# Patient Record
Sex: Male | Born: 1980
Health system: Southern US, Community
[De-identification: ages and names within clinical notes are randomized; demographics above are authoritative.]

## PROBLEM LIST (undated history)

## (undated) DIAGNOSIS — I499 Cardiac arrhythmia, unspecified: Secondary | ICD-10-CM

## (undated) DIAGNOSIS — T7840XA Allergy, unspecified, initial encounter: Secondary | ICD-10-CM

## (undated) DIAGNOSIS — E785 Hyperlipidemia, unspecified: Secondary | ICD-10-CM

## (undated) HISTORY — DX: Hyperlipidemia, unspecified: E78.5

## (undated) HISTORY — DX: Allergy, unspecified, initial encounter: T78.40XA

## (undated) HISTORY — DX: Cardiac arrhythmia, unspecified: I49.9

---

## 2019-03-18 ENCOUNTER — Telehealth: Payer: Self-pay | Admitting: Family

## 2019-03-18 DIAGNOSIS — R200 Anesthesia of skin: Secondary | ICD-10-CM

## 2019-03-18 NOTE — Progress Notes (Signed)
Based on what you shared with me, I feel your condition warrants further evaluation and I recommend that you be seen for a face to face office visit.  Given your numbness in your right hand, you need to be seen face to face to rule out other serious problems.    NOTE: If you entered your credit card information for this eVisit, you will not be charged. You may see a "hold" on your card for the $35 but that hold will drop off and you will not have a charge processed.  If you are having a true medical emergency please call 911.  If you need an urgent face to face visit, [place] has four urgent care centers for your convenience.    PLEASE NOTE: THE [facility] LOCATIONS AND URGENT CARE CLINICS DO NOT HAVE THE TESTING FOR CORONAVIRUS COVID19 AVAILABLE.  IF YOU FEEL YOU NEED THIS TEST YOU MUST GO TO A TRIAGE LOCATION AT ONE OF THE HOSPITAL EMERGENCY DEPARTMENTS   [email or web address] to reserve your spot online an avoid wait times  [facility] [address] [place], [place] [address] Modified hours of operation: Monday-Friday, 10 AM to 6 PM  Saturday & Sunday 10 AM to 4 PM *Across the street from [employer]  [facility] (New Address!) [address] [place], [place] [address] *Just off [employer], across the road from [place]* Modified hours of operation: Monday-Friday, 10 AM to 5 PM  Closed Saturday & Sunday   The following sites will take your insurance:  . [facility]  [phone] Get Driving Directions Find a Provider at this Location  [address] [place], [place] [address] . 10 am to 8 pm Monday-Friday . 12 pm to 8 pm Saturday-Sunday   . [facility]  [phone] Get Driving Directions Find a Provider at this Location  [address] [place], [place] [address] . 8 am to 8 pm Monday-Friday . 9 am to 6 pm Saturday . 11 am to 6 pm  Sunday   . [facility] at [facility]  [phone] Get Driving Directions  [address] [place], [place] [address] . 8 am to 8 pm Monday-Friday . 8 am to 4 pm Saturday-Sunday   Your e-visit answers were reviewed by a board certified advanced clinical practitioner to complete your personal care plan.  Thank you for using e-Visits.

## 2019-04-24 ENCOUNTER — Other Ambulatory Visit: Payer: Self-pay

## 2019-04-24 ENCOUNTER — Encounter: Payer: Self-pay | Admitting: Family Medicine

## 2019-04-24 ENCOUNTER — Ambulatory Visit (INDEPENDENT_AMBULATORY_CARE_PROVIDER_SITE_OTHER): Payer: No Typology Code available for payment source | Admitting: Family Medicine

## 2019-04-24 DIAGNOSIS — R209 Unspecified disturbances of skin sensation: Secondary | ICD-10-CM | POA: Diagnosis not present

## 2019-04-24 DIAGNOSIS — R0602 Shortness of breath: Secondary | ICD-10-CM

## 2019-04-24 DIAGNOSIS — R002 Palpitations: Secondary | ICD-10-CM | POA: Diagnosis not present

## 2019-04-24 NOTE — Progress Notes (Signed)
Virtual Visit via Video Note   I connected with [name] on 04/25/19 at 12:00 PM EDT by a video enabled telemedicine application and verified that I am speaking with the correct person using two identifiers.  Location patient: home Location provider:work Persons participating in the virtual visit: patient, provider  I discussed the limitations of evaluation and management by telemedicine and the availability of in person appointments. The patient expressed understanding and agreed to proceed.   HPI: [name] is a 38 yo male otherwise healthy,who is establishing care today. He has some concerns today.  C/O episode of SOB,feeling like something is moving in his whole chest (?palpitations),right hand and left thigh numbness, and feeling like he was going to pass out. It has happened 2 times when he was in his car (driving), 2/533/30 and 6/6/444/8/20.Marland Kitchen.   He has had some of these symptoms but milder when he is in his office but with no numbness sensation.  Denies associated headache,visual changes, cough,wheezing, abdominal pain, nausea, vomiting, focal weakness,or seizure like activity.  Usually last about 10-15 min. Alleviated by "fresh and cold air" like the [facility] in his car. He is not sure about exacerbating factors.  He is reporting this is a new problem.  He denies history of depression or anxiety but states that since the beginning of March he has been under a lot of stress.  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 He lives with his family.  He has not tried OTC medications. He denies history of tobacco use or illicit drugs. He is reporting years history of "cardiac arrhythmia", he is not sure about etiology or cardiac work-up.  He is also complaining about a few months of intermittent chest and lower back "needle sensation" He denies upper or lower back pain. Negative for fever, chills, night sweats, arthritis, or skin rash.   ROS: See  pertinent positives and negatives per HPI.  Past Medical History:  Diagnosis Date  . Cardiac arrhythmia, unspecified     History reviewed. No pertinent surgical history.  Family History  Problem Relation Age of Onset  . Diabetes Mother   . Hypertension Father     Social History   Socioeconomic History  . Marital status: Single    Spouse name: Not on file  . Number of children: Not on file  . Years of education: Not on file  . Highest education level: Not on file  Occupational History  . Not on file  Social Needs  . Financial resource strain: Not on file  . Food insecurity:    Worry: Not on file    Inability: Not on file  . Transportation needs:    Medical: Not on file    Non-medical: Not on file  Tobacco Use  . Smoking status: Never Smoker  . Smokeless tobacco: Never Used  Substance and Sexual Activity  . Alcohol use: Not on file  . Drug use: Never  . Sexual activity: Not on file  Lifestyle  . Physical activity:    Days per week: Not on file    Minutes per session: Not on file  . Stress: Not on file  Relationships  . Social connections:    Talks on phone: Not on file    Gets together: Not on file    Attends religious service: Not on file    Active member of club or organization: Not on file    Attends meetings  of clubs or organizations: Not on file    Relationship status: Not on file  . Intimate partner violence:    Fear of current or ex partner: Not on file    Emotionally abused: Not on file    Physically abused: Not on file    Forced sexual activity: Not on file  Other Topics Concern  . Not on file  Social History Narrative  . Not on file   No current outpatient medications on file.  EXAM:  VITALS per patient if applicable:N/A  GENERAL: alert, oriented, appears well and in no acute distress  HEENT: atraumatic, conjunttiva clear, no obvious facial abnormalities on inspection.  NECK: normal movements of the head and neck  LUNGS: on inspection  no signs of respiratory distress, breathing rate appears normal, no obvious gross SOB, gasping or wheezing  CV: no obvious cyanosis  MS: moves all visible extremities without noticeable abnormality  PSYCH/NEURO: pleasant and cooperative, no obvious depression,he is anxious.Speech and thought processing grossly intact  ASSESSMENT AND PLAN:  Discussed the following assessment and plan: Orders Placed This Encounter  Procedures  . CBC with Differential/Platelet  . Comprehensive metabolic panel  . Vitamin B12  . TSH  . EKG 12-Lead    Skin sensation disturbance  We discussed possible etiologies. ? Anxiety,electrolyte abnormality,endocrinologic disorder among some. He has not had symptoms for the past 3-4 weeks. Instructed about warning signs.  Palpitations He is reporting years Hx of arrhythmia,he is not sure if he has had cardiac work-up. ? Anxiety. Adequate hydration. We will arrange blood work and EKG. Instructed about warning signs.  Shortness of breath - Plan: EKG 12-Lead Possible causes discussed. No associated cough or wheezing. I do not think imaging is needed at this time. EKG and blood work will be arranged. Instructed about warning signs.    I discussed the assessment and treatment plan with the patient. He was provided an opportunity to ask questions and all were answered. The patient agreed with the plan and demonstrated an understanding of the instructions.   The patient was advised to call back or seek an in-person evaluation if the symptoms worsen or if the condition fails to improve as anticipated.  Return if symptoms worsen or fail to improve, for Depending on work up labs and EKG.    Betty Swaziland, MD

## 2019-04-25 ENCOUNTER — Encounter: Payer: Self-pay | Admitting: Family Medicine

## 2019-05-05 ENCOUNTER — Other Ambulatory Visit: Payer: No Typology Code available for payment source

## 2019-05-05 ENCOUNTER — Telehealth: Payer: Self-pay | Admitting: *Deleted

## 2019-05-05 NOTE — Telephone Encounter (Signed)
Copied from CRM [id number]. Topic: Appointment Scheduling - Scheduling Inquiry for Clinic >> May 05, 2019  7:54 AM Fanny Bien wrote: Reason for CRM: pt called and canceled labs for today because of a low grade fever. Pt would like to talk to betty Swaziland about fever. Please advise

## 2019-05-06 ENCOUNTER — Encounter: Payer: Self-pay | Admitting: Family Medicine

## 2019-05-06 ENCOUNTER — Ambulatory Visit (INDEPENDENT_AMBULATORY_CARE_PROVIDER_SITE_OTHER): Payer: No Typology Code available for payment source | Admitting: Family Medicine

## 2019-05-06 ENCOUNTER — Other Ambulatory Visit: Payer: Self-pay

## 2019-05-06 ENCOUNTER — Telehealth: Payer: Self-pay | Admitting: *Deleted

## 2019-05-06 VITALS — Resp 12

## 2019-05-06 DIAGNOSIS — R509 Fever, unspecified: Secondary | ICD-10-CM | POA: Diagnosis not present

## 2019-05-06 DIAGNOSIS — Z20822 Contact with and (suspected) exposure to covid-19: Secondary | ICD-10-CM

## 2019-05-06 DIAGNOSIS — Z20828 Contact with and (suspected) exposure to other viral communicable diseases: Secondary | ICD-10-CM

## 2019-05-06 NOTE — Telephone Encounter (Signed)
Patient scheduled virtual visit for today, 05/06/2019 with Dr. Swaziland.

## 2019-05-06 NOTE — Progress Notes (Addendum)
Virtual Visit via Video Note   I connected with Joel Thompson on 05/06/19 at  3:30 PM EDT by a video enabled telemedicine application and verified that I am speaking with the correct person using two identifiers.  Location patient: home Location provider:home office Persons participating in the virtual visit: patient, provider  I discussed the limitations of evaluation and management by telemedicine and the availability of in person appointments. The patient expressed understanding and agreed to proceed.   HPI: Joel Thompson is a 38 yo male c/o "low grade fever" noted Monday, 3 days ago, 99.4 F. Checked temp initially because he felt warm.  Yesterday temp 99.2 F.  He has not had other symptom. He denies sick contact,travel,or possible contact with person infected with COVID 19.  Denies fatigue,changes in appetite, body aches,rash,headache,sore throat,cough,dyspena,wheezing,abdominal pain,nausea,vomiting,changes in bowel habits,or urinary symptoms. Nasal congestion,exacerbated by lying down.  He has not taken OTC medication.   ROS: See pertinent positives and negatives per HPI.  Past Medical History:  Diagnosis Date  . Cardiac arrhythmia, unspecified     No past surgical history on file.  Family History  Problem Relation Age of Onset  . Diabetes Mother   . Hypertension Father     Social History   Socioeconomic History  . Marital status: Single    Spouse name: Not on file  . Number of children: Not on file  . Years of education: Not on file  . Highest education level: Not on file  Occupational History  . Not on file  Social Needs  . Financial resource strain: Not on file  . Food insecurity:    Worry: Not on file    Inability: Not on file  . Transportation needs:    Medical: Not on file    Non-medical: Not on file  Tobacco Use  . Smoking status: Never Smoker  . Smokeless tobacco: Never Used  Substance and Sexual Activity  . Alcohol use: Not on file  . Drug use: Never   . Sexual activity: Not on file  Lifestyle  . Physical activity:    Days per week: Not on file    Minutes per session: Not on file  . Stress: Not on file  Relationships  . Social connections:    Talks on phone: Not on file    Gets together: Not on file    Attends religious service: Not on file    Active member of club or organization: Not on file    Attends meetings of clubs or organizations: Not on file    Relationship status: Not on file  . Intimate partner violence:    Fear of current or ex partner: Not on file    Emotionally abused: Not on file    Physically abused: Not on file    Forced sexual activity: Not on file  Other Topics Concern  . Not on file  Social History Narrative  . Not on file     No current outpatient medications on file.  EXAM:  VITALS per patient if applicable:Resp 12   GENERAL: alert, oriented, appears well and in no acute distress  HEENT: atraumatic, conjunctiva clear, no obvious abnormalities on inspection of external nose/face.  NECK: normal movements of the head and neck  LUNGS: on inspection no signs of respiratory distress, breathing rate appears normal, no obvious gross SOB, gasping or wheezing  CV: no obvious cyanosis  MS: moves all visible extremities without noticeable abnormality  PSYCH/NEURO: pleasant and cooperative, no obvious depression,anxious;speech and thought  processing grossly intact  ASSESSMENT AND PLAN:  Discussed the following assessment and plan:  Fever, unspecified Possible etiologies discussed. Explained that temp < 100 F is not considered as fever but could be the beginning of a viral synd. He is concerned about COVID 19, test will be arranged. Quarantine recommended until result is back. Instructed about warning signs.   15  min face to face OV. > 50% was dedicated to discussion of Dx, prognosis, treatment options, and coordination of care.  I discussed the assessment and treatment plan with the patient.  He was provided an opportunity to ask questions and all were answered. The patient agreed with the plan and demonstrated an understanding of the instructions.   The patient was advised to call back or seek an in-person evaluation if the symptoms worsen or if the condition fails to improve as anticipated.  Return if symptoms worsen or fail to improve.     [place], MD

## 2019-05-06 NOTE — Telephone Encounter (Signed)
Left message for patient to return call to office. 

## 2019-05-06 NOTE — Telephone Encounter (Signed)
Pt scheduled for covid testing tomorrow 0930 at [facility] site. Orders per Dr. Betty Swaziland. Pt with fever x 3 days. Reviewed testing process with pt; verbalizes understanding.

## 2019-05-07 ENCOUNTER — Other Ambulatory Visit: Payer: No Typology Code available for payment source

## 2019-05-07 DIAGNOSIS — Z20822 Contact with and (suspected) exposure to covid-19: Secondary | ICD-10-CM

## 2019-05-08 LAB — NOVEL CORONAVIRUS, NAA: SARS-CoV-2, NAA: NOT DETECTED

## 2019-05-13 ENCOUNTER — Telehealth: Payer: Self-pay | Admitting: *Deleted

## 2019-05-13 NOTE — Telephone Encounter (Signed)
Left message to return call to the office to schedule lab appointment.

## 2019-05-13 NOTE — Telephone Encounter (Signed)
Copied from CRM [id number]. Topic: General - Other >> May 13, 2019 12:10 PM Jaquita Rector A wrote: Reason for CRM: Patient called to reschedule his lab appointment can be reached at Ph# [phone]

## 2019-05-14 ENCOUNTER — Encounter: Payer: Self-pay | Admitting: *Deleted

## 2019-05-14 ENCOUNTER — Telehealth: Payer: Self-pay | Admitting: Family Medicine

## 2019-05-14 NOTE — Telephone Encounter (Signed)
Left message for patient to return call to office to reschedule lab appointment.

## 2019-05-14 NOTE — Telephone Encounter (Addendum)
Patient has a question on the labs and EKG that was ordered.  He states he was having chest pains this past Saturday, 05/09/19, so he went to the ED.  He would like a call to find out if he needs the CBC, metabolic panel and EKG done due to the ED doing the same lab and EFG.  He went to [facility] in [place].

## 2019-05-15 ENCOUNTER — Other Ambulatory Visit: Payer: No Typology Code available for payment source

## 2019-05-15 NOTE — Telephone Encounter (Signed)
Patient returned called to office and stated that he went to the ER and they did labs and EKG.

## 2019-05-15 NOTE — Telephone Encounter (Signed)
Message sent to Dr. Jordan for review. Please advise 

## 2019-05-15 NOTE — Telephone Encounter (Signed)
I cannot see ER visit. On 04/24/19 I placed future orders for EKG,TSH,CMP,CBC,and B12. If these labs and EKG were done,they do not need to be repeated unless something is abnormal.  Please ask him to have labs fax to the office.  Most likely he was also recommended to follow with PCP if not better.  Thanks, BJ

## 2019-05-18 ENCOUNTER — Encounter: Payer: Self-pay | Admitting: *Deleted

## 2019-05-18 NOTE — Telephone Encounter (Signed)
Patient informed. 

## 2019-05-21 ENCOUNTER — Telehealth: Payer: Self-pay | Admitting: Family Medicine

## 2019-05-21 ENCOUNTER — Encounter: Payer: Self-pay | Admitting: Family Medicine

## 2019-05-21 ENCOUNTER — Telehealth: Payer: Self-pay | Admitting: *Deleted

## 2019-05-21 ENCOUNTER — Ambulatory Visit (INDEPENDENT_AMBULATORY_CARE_PROVIDER_SITE_OTHER): Payer: No Typology Code available for payment source | Admitting: Family Medicine

## 2019-05-21 ENCOUNTER — Other Ambulatory Visit: Payer: Self-pay

## 2019-05-21 ENCOUNTER — Telehealth: Payer: Self-pay

## 2019-05-21 VITALS — Temp 98.7°F

## 2019-05-21 DIAGNOSIS — R05 Cough: Secondary | ICD-10-CM | POA: Diagnosis not present

## 2019-05-21 DIAGNOSIS — R6889 Other general symptoms and signs: Secondary | ICD-10-CM | POA: Diagnosis not present

## 2019-05-21 DIAGNOSIS — R059 Cough, unspecified: Secondary | ICD-10-CM

## 2019-05-21 DIAGNOSIS — Z20822 Contact with and (suspected) exposure to covid-19: Secondary | ICD-10-CM

## 2019-05-21 DIAGNOSIS — R0981 Nasal congestion: Secondary | ICD-10-CM | POA: Diagnosis not present

## 2019-05-21 NOTE — Telephone Encounter (Addendum)
Patient called and advised of the request for covid testing by Dr. Maudie Mercury, he says the [facility] scheduled him for tomorrow at [facility], so he will go there.

## 2019-05-21 NOTE — Telephone Encounter (Signed)
-----   Message from Lucretia Kern, DO sent at 05/21/2019  1:51 PM EDT ----- -coronavirus testing-follow up with Dr. Martinique or Maudie Mercury on Monday or Tuesday

## 2019-05-21 NOTE — Patient Instructions (Addendum)
I have asked my assistant to order Coronavirus (COVID19) testing for you. Please call our office if you have any concerns or questions or this testing has not been arranged in the next 24-48 hours.  Please call [facility] today in terms of work restrictions.   Self Isolate: -see the CDC site for information:   RunningShows.co.za.html   -stay home except for to seek medical care -stay in your own room away from others in your house, wash hands frequently, wear a mask if you leave your room and interacted as little as possible with others -seek medical care if worsening - call out office for a visit or call ahead if going elsewhere -seek emergency care if very sick or severe symptoms - call 911 -isolate for at least 10 days from the onset of symptoms PLUS 3 days of no fever PLUS 3 days of improving symptoms, unless instructed otherwise by a doctor.   Follow up with Dr. Martinique on Monday or Tuesday. Sooner as needed.

## 2019-05-21 NOTE — Telephone Encounter (Signed)
Community message sent to the [facility] for COVID testing and I left a detailed message at the pts cell number someone will call with appt info and that he call back to schedule a follow up visit as below.

## 2019-05-21 NOTE — Telephone Encounter (Signed)
Copied from [place] [id number]. Topic: Quick Communication - See Telephone Encounter >> May 21, 2019  2:34 PM Blase Mess A wrote: CRM for notification. See Telephone encounter for: 05/21/19.  Patient is calling back that an order was placed by [facility] [employer] for [employer]. Thank you CB- [phone]

## 2019-05-21 NOTE — Progress Notes (Signed)
Virtual Visit via Video Note  I connected with EJ  on 05/21/19 at  1:20 PM EDT by a video enabled telemedicine application and verified that I am speaking with the correct person using two identifiers.  Location patient: home Location provider:work or home office Persons participating in the virtual visit: patient, provider  I discussed the limitations of evaluation and management by telemedicine and the availability of in person appointments. The patient expressed understanding and agreed to proceed.   HPI:  Acute visit for feeling sick/? Fever: -started 2 days ago -symptoms include feeling like has a fever "feeling warm", fatigue -Temp 99.5 for a few days -temp today 99 -other symptoms: headache - brief, sinus congestion, cough - but has had some in the past -no diarrhea, vomiting, rash, sick contacts -does work outside the home, IT at [employer], wears mask, has not been around anyone whom has been sick -he works around a lot of people, does want to test for COVID19 -history of some resp symptoms a few weeks ago and was tested - he thinks was related to exposure in the building he was in, old, dusty building  as he was feeling better when he moved to another location/building   ROS: See pertinent positives and negatives per HPI.  Past Medical History:  Diagnosis Date  . Cardiac arrhythmia, unspecified     History reviewed. No pertinent surgical history.  Family History  Problem Relation Age of Onset  . Diabetes Mother   . Hypertension Father     SOCIAL HX: see HPI  No current outpatient medications on file.  EXAM:  VITALS per patient if applicable:  GENERAL: alert, oriented, appears well and in no acute distress  HEENT: atraumatic, conjunttiva clear, no obvious abnormalities on inspection of external nose and ears  NECK: normal movements of the head and neck  LUNGS: on inspection no signs of respiratory distress, breathing rate appears normal, no obvious gross  SOB, gasping or wheezing  CV: no obvious cyanosis  MS: moves all visible extremities without noticeable abnormality  PSYCH/NEURO: pleasant and cooperative, no obvious depression or anxiety, speech and thought processing grossly intact  ASSESSMENT AND PLAN:  Discussed the following assessment and plan: More than 50% of over 25 minutes spent counseling and/or coordinating care.   Suspected 2019 Novel Coronavirus Infection   Sinus congestion   Cough  -we discussed possible serious and likely etiologies, workup and treatment, treatment risks and return precautions; possible allergies, viral infection or COVID19. -after this discussion, Prakash opted for COVID19 testing, nasal saline, zyrtec or allergy pill, OTC analgesic if needed per instructions. -discussed self isolation and advised he call health at work as well for work restrictions -follow up advised weih PCP Monday or tuesday -of course, we advised Gwin  to return or notify a doctor immediately if symptoms worsen or persist or new concerns arise.    I discussed the assessment and treatment plan with the patient. The patient was provided an opportunity to ask questions and all were answered. The patient agreed with the plan and demonstrated an understanding of the instructions.  Patient Instructions  I have asked my assistant to order Coronavirus (COVID19) testing for you. Please call our office if you have any concerns or questions or this testing has not been arranged in the next 24-48 hours.  Please call [facility] today in terms of work restrictions.   Self Isolate: -see the CDC site for information:   RunningShows.co.za.html   -stay home except for  to seek medical care -stay in your own room away from others in your house, wash hands frequently, wear a mask if you leave your room and interacted as little as possible with others -seek medical care if worsening  - call out office for a visit or call ahead if going elsewhere -seek emergency care if very sick or severe symptoms - call 911 -isolate for at least 10 days from the onset of symptoms PLUS 3 days of no fever PLUS 3 days of improving symptoms, unless instructed otherwise by a doctor.   Follow up with Dr. SwazilandJordan on Monday or Tuesday. Sooner as needed.    Follow up instructions: Advised assistant Ronnald CollumJo Anne to help patient arrange the following: -coronavirus testing -follow up with Dr. SwazilandJordan or Selena BattenKim on Monday or Tuesday   Terressa KoyanagiHannah R Kim, DO

## 2019-05-26 ENCOUNTER — Other Ambulatory Visit: Payer: Self-pay

## 2019-05-26 ENCOUNTER — Encounter: Payer: Self-pay | Admitting: Family Medicine

## 2019-05-26 ENCOUNTER — Ambulatory Visit (INDEPENDENT_AMBULATORY_CARE_PROVIDER_SITE_OTHER): Payer: No Typology Code available for payment source | Admitting: Family Medicine

## 2019-05-26 DIAGNOSIS — E876 Hypokalemia: Secondary | ICD-10-CM

## 2019-05-26 DIAGNOSIS — M7918 Myalgia, other site: Secondary | ICD-10-CM | POA: Diagnosis not present

## 2019-05-26 DIAGNOSIS — R1011 Right upper quadrant pain: Secondary | ICD-10-CM | POA: Diagnosis not present

## 2019-05-26 NOTE — Progress Notes (Signed)
Virtual Visit via Video Note   I connected with [name] on 05/26/19 at  3:45 PM EDT by a video enabled telemedicine application and verified that I am speaking with the correct person using two identifiers.  Location patient: home Location provider:work office Persons participating in the virtual visit: patient, provider  I discussed the limitations of evaluation and management by telemedicine and the availability of in person appointments. The patient expressed understanding and agreed to proceed.   HPI: He is following today on recent OV and ER visit. I last saw him through virtual visit on 05/06/2019, when he was complaining about fever. COVID-19 testing was negative. She has not had fever since Saturday, 99.5 F  He was evaluated in the ER yesterday, when he was complaining about upper abdominal pain (RUQ). Today he has not had abdominal pain. Denies associated chills, fatigue, chest pain, nausea, vomiting, changes in bowel habits, or urinary symptoms.  He has not identified exacerbating or alleviating factors. RUQ [place] was negative for gallbladder disease. He was prescribed Bentyl.   Urinalysis with Possible Microscopy and Possible Culture (05/25/2019 1:17 AM EDT) Urinalysis with Possible Microscopy and Possible Culture (05/25/2019 1:17 AM EDT)  Component Value Ref Range Performed At Pathologist Signature  Urine Color Yellow Yellow  [facility]   Urine Appearance Clear Clear [facility]   Urine Specific Gravity 1.021 1.005 - 1.030 [facility]   Urine pH 6.0 5 to 9 [facility]   Urine Protein - Dipstick Negative Negative mg/dl [facility]   Urine Glucose Negative Negative mg/dL [facility]   Urine Ketones Trace (A) Negative mg/dl [facility]   Urine Bilirubin Negative Negative mg/dL [facility]   Urine Blood Negative Negative mg/dL [facility]   Urine Urobilinogen <2   <2 mg/dl [facility]   Urine Nitrite Negative Negative [place]   Urine Leukocyte Esterase Negative Negative Leu/mcL [place]    Lipase (05/25/2019 1:04 AM EDT) Lipase (05/25/2019 1:04 AM EDT)  Component Value Ref Range Performed At Pathologist Signature  Lipase 43 0 - 59 IU/L     Mild hypoK+.  Comprehensive Metabolic Panel (68/34/1962 1:04 AM EDT) Comprehensive Metabolic Panel (22/97/9892 1:04 AM EDT)  Component Value Ref Range Performed At Pathologist Signature  Na 138 136 - 146 mmol/L [facility]   Potassium 3.5 (L) 3.7 - 5.4 mmol/L [facility]   Cl 99 97 - 108 mmol/L [facility]   CO2 24 20 - 32 mmol/L [facility]   Glucose 114 (H) 65 - 99 mg/dL [facility]   BUN 10 6 - 20 mg/dL [facility]   Creatinine 0.80 0.76 - 1.27 mg/dL [facility]   Ca 9.2 8.7 - 10.2 mg/dL [facility]   ALK PHOS 65 25 - 150 IU/L [facility]   T Bili 0.60 0.00 - 1.20 mg/dL [facility]   Total Protein 7.2 6.0 - 8.5 gm/dL [facility]   Alb 4.5 3.5 - 5.5 gm/dL [facility]   GLOBULIN 2.7 1.5 - 4.5 gm/dL [facility]   ALBUMIN/GLOBULIN RATIO 1.7 1.1 - 2.5 [facility]   BUN/CREAT RATIO 12.5 11.0 - 26.0 [place]   ALT 12 0 - 55 IU/L [place]   AST 15 0 - 40 IU/L [place]   GFR AFRICAN AMERICAN 134 Comment:  African-American:  Normal GFR (  glomerular filtration rate) > 60 mL/min/1.73 meters squared. < 60 may include impaired kidney function based on creatinine, age, legal sex, and race normalized to accepted average body surface area  mL/min/1.54m [facility]  GFR Non African American 116      CBC and Differential (05/25/2019 1:04 AM EDT) CBC and Differential (05/25/2019 1:04 AM EDT)  Component Value Ref Range Performed At Pathologist Signature   WBC 8.7 5.1 - 10.8 thou/mcL [place]  RBC 4.58 4.05 - 5.64 million/mcL [place]  HGB 14.3 13.5 - 17.5 gm/dL [facility]   HCT 42.0 40.5 - 52.5 % [facility]   MCV 92 83 - 97 fL [place]  MCH 31.2 28.0 - 33.0 pg [facility]   MCHC 34.0 32.0 - 36.0 gm/dL [facility]   Plt Ct 340 150 - 400 thou/mcL [place]  RDW SD 39.3 36.0 - 47.0 fL [place]  MPV 9.4 8.9 - 11.0 fL [place]  NRBC% 0.0 0 /100WBC [place]  NRBC 0.000 0 thou/mcL [place]  NEUTROPHIL % 64.6 50.0 - 70.0 % [facility]   LYMPHOCYTE % 26.9 25.0 - 40.0 % [facility]   MONOCYTE % 6.9 4.0 - 12.0 % [facility]   Eosinophil % 0.3 (L) 1.0 - 6.0 % [facility]   BASOPHIL % 0.5 0.0 - 2.0 % [facility]   IG% 0.800 (H) 0.001 - 0.429 % [facility]   ABSOLUTE NEUTROPHIL COUNT 5.64 1.50 - 7.50 thou/mcL [facility]   ABSOLUTE LYMPHOCYTE COUNT 2.4 1.0 - 4.5 thou/mcL [facility]   MONO ABSOLUTE 0.6 0.1 - 0.8 thou/mcL [facility]   EOS ABSOLUTE 0.0 0.0 - 0.5 thou/mcL [facility]   BASO ABSOLUTE 0.0 0.0 - 0.2 thou/mcL [facility]   IG ABSOLUTE 0.070 (H) 0.001 - 0.031 thou/mcL  [facility]    He is also complaining about pain anterior to left axilla/rigth under anterior shoulder; for the past 3 to 4 days, intermittently, mild. It is not exacerbated by palpation but by certain upper extremity movements. He has not noted edema, erythema, or limitation of shoulder range of motion. He has not taking OTC medication. No history of trauma or unusual physical activity. No prior history of similar problem.  He is also asking if he still needs to have labs order on 04/24/2019, when he was complaining about palpitations, chest pain, and  right upper extremity numbness. EKG,b12,TSH,CMP,and CBC were ordered. All these symptoms resolved after he was transfer to a different department.   ROS: See pertinent positives and negatives per HPI.  Past Medical History:  Diagnosis Date  . Cardiac arrhythmia, unspecified     No past surgical history on file.  Family History  Problem Relation Age of Onset  . Diabetes Mother   . Hypertension Father     Social History   Socioeconomic History  . Marital status: Single    Spouse name: Not on file  . Number of children: Not on file  . Years of education: Not on file  . Highest education level: Not on file  Occupational History  . Not on file  Social Needs  . Financial resource strain: Not on file  . Food insecurity    Worry: Not on file    Inability: Not on file  .  Transportation needs    Medical: Not on file    Non-medical: Not on file  Tobacco Use  . Smoking status: Never Smoker  . Smokeless tobacco: Never Used  Substance and Sexual Activity  . Alcohol use: Not on file  . Drug use: Never  . Sexual activity: Not on file  Lifestyle  . Physical activity    Days per week: Not on file    Minutes per session: Not on file  . Stress: Not on file  Relationships  . Social Herbalist on phone: Not on file    Gets together: Not on file    Attends religious service: Not on file    Active member of club or organization: Not on file    Attends meetings of clubs or organizations: Not on file    Relationship status: Not on file  . Intimate partner violence    Fear of current or ex partner: Not on file    Emotionally abused: Not on file    Physically abused: Not on file    Forced sexual activity: Not on file  Other Topics Concern  . Not on file  Social History Narrative  . Not on file    No current outpatient medications on file.  EXAM:  VITALS per patient if applicable:N/A  GENERAL: alert, oriented, appears well and in no acute distress  HEENT:  atraumatic, conjunttiva clear, no obvious facial abnormalities on inspection.  NECK: normal movements of the head and neck  LUNGS: on inspection no signs of respiratory distress, breathing rate appears normal, no obvious gross SOB, gasping or wheezing  CV: no obvious cyanosis  MS: moves all visible extremities without noticeable abnormality. No limitation of left shoulder ROM, no pain elicited.  PSYCH/NEURO: pleasant and cooperative, no obvious depression, + anxious, speech and thought processing grossly intact  ASSESSMENT AND PLAN:  Discussed the following assessment and plan:  Hypokalemia - Plan: Mild, recommend potassium rich diet, a daily banana is a good option. Instructed about warning signs. Follow-up as needed.  Musculoskeletal pain - Plan: Pain anterior to left axilla he is describing does not seem to be related with a serious process. For now recommend monitoring for new symptoms. He can apply OTC IcyHot or Aspercreme on affected area. Follow-up as needed.  RUQ abdominal pain - Plan: Improved. Continue Bentyl daily as needed. Bland diet, small portions at a time. Instructed about warning signs. At this time I do not think further work-up is needed.  If persistent we could consider GI evaluation.  In regard to labs ordered on 04/24/2019, some were already done in the ER yesterday.  Problem resolved, so I do not think further work-up is needed. It seems like symptoms (palpitation,SOB,and skin sensation disturbance) were related to stress. Instructed to monitor for recurrence.     I discussed the assessment and treatment plan with the patient. He was provided an opportunity to ask questions and all were answered. The patient agreed with the plan and demonstrated an understanding of the instructions.   The patient was advised to call back or seek an in-person evaluation if the symptoms worsen or if the condition fails to improve as anticipated.  Return if symptoms  worsen or fail to improve.    Ahnya Akre Martinique, MD

## 2019-06-19 ENCOUNTER — Ambulatory Visit (INDEPENDENT_AMBULATORY_CARE_PROVIDER_SITE_OTHER): Payer: No Typology Code available for payment source | Admitting: Family Medicine

## 2019-06-19 ENCOUNTER — Other Ambulatory Visit: Payer: Self-pay

## 2019-06-19 ENCOUNTER — Encounter: Payer: Self-pay | Admitting: Family Medicine

## 2019-06-19 VITALS — BP 116/74 | HR 90 | Temp 98.5°F | Resp 12 | Ht 64.0 in | Wt 101.2 lb

## 2019-06-19 DIAGNOSIS — Z0000 Encounter for general adult medical examination without abnormal findings: Secondary | ICD-10-CM | POA: Diagnosis not present

## 2019-06-19 DIAGNOSIS — Z130 Encounter for screening for diseases of the blood and blood-forming organs and certain disorders involving the immune mechanism: Secondary | ICD-10-CM | POA: Diagnosis not present

## 2019-06-19 DIAGNOSIS — Z13220 Encounter for screening for lipoid disorders: Secondary | ICD-10-CM

## 2019-06-19 DIAGNOSIS — R002 Palpitations: Secondary | ICD-10-CM

## 2019-06-19 DIAGNOSIS — Z1329 Encounter for screening for other suspected endocrine disorder: Secondary | ICD-10-CM

## 2019-06-19 DIAGNOSIS — R209 Unspecified disturbances of skin sensation: Secondary | ICD-10-CM | POA: Diagnosis not present

## 2019-06-19 DIAGNOSIS — E876 Hypokalemia: Secondary | ICD-10-CM | POA: Diagnosis not present

## 2019-06-19 DIAGNOSIS — Z23 Encounter for immunization: Secondary | ICD-10-CM | POA: Diagnosis not present

## 2019-06-19 DIAGNOSIS — R5383 Other fatigue: Secondary | ICD-10-CM

## 2019-06-19 DIAGNOSIS — Z13228 Encounter for screening for other metabolic disorders: Secondary | ICD-10-CM | POA: Diagnosis not present

## 2019-06-19 LAB — VITAMIN B12: Vitamin B-12: 431 pg/mL (ref 211–911)

## 2019-06-19 LAB — LIPID PANEL
Cholesterol: 217 mg/dL — ABNORMAL HIGH (ref 0–200)
HDL: 43.1 mg/dL (ref >39.00)
NonHDL: 173.89
Total CHOL/HDL Ratio: 5
Triglycerides: 205 mg/dL — ABNORMAL HIGH (ref 0.0–149.0)
VLDL: 41 mg/dL — ABNORMAL HIGH (ref 0.0–40.0)

## 2019-06-19 LAB — LDL CHOLESTEROL, DIRECT: Direct LDL: 159 mg/dL

## 2019-06-19 LAB — TSH: TSH: 1.29 u[IU]/mL (ref 0.35–4.50)

## 2019-06-19 LAB — POTASSIUM: Potassium: 4.1 mEq/L (ref 3.5–5.1)

## 2019-06-19 LAB — HEMOGLOBIN A1C: Hgb A1c MFr Bld: 5.1 % (ref 4.6–6.5)

## 2019-06-19 NOTE — Patient Instructions (Signed)
A few things to remember from today's visit:   Routine general medical examination at a health care facility  Need for Tdap vaccination - Plan: Tdap vaccine greater than or equal to 36yo IM  Skin sensation disturbance - Plan: TSH, Vitamin B12  Hypokalemia  Palpitations - Plan: TSH, Ambulatory referral to Cardiology  Screening for lipoid disorders - Plan: Lipid panel  Screening for endocrine, metabolic and immunity disorder - Plan: Hemoglobin A1c  Other fatigue   At least 150 minutes of moderate exercise per week, daily brisk walking for 15-30 min is a good exercise option. Healthy diet low in saturated (animal) fats and sweets and consisting of fresh fruits and vegetables, lean meats such as fish and white chicken and whole grains.  - Vaccines:  Tdap vaccine every 10 years.  Shingles vaccine recommended at age 17, could be given after 36 years of age but not sure about insurance coverage.  Pneumonia vaccines:  Prevnar 81 at 32 and Pneumovax at 81.   -Screening recommendations for low/normal risk males:  Screening for diabetes at age 18 and every 3 years. Earlier screening if cardiovascular risk factors.   Lipid screening at 35 and every 3 years. Screening starts in younger males with cardiovascular risk factors.  Colon cancer screening at age 6 and until age 58.  Prostate cancer screening: some controversy, starts usually at 46: Rectal exam and PSA.  Aortic Abdominal Aneurism once between 38 and 57 years old if ever smoker.  Also recommended:  1. Dental visit- Brush and floss your teeth twice daily; visit your dentist twice a year. 2. Eye doctor- Get an eye exam at least every 2 years. 3. Helmet use- Always wear a helmet when riding a bicycle, motorcycle, rollerblading or skateboarding. 4. Safe sex- If you may be exposed to sexually transmitted infections, use a condom. 5. Seat belts- Seat belts can save your live; always wear one. 6. Smoke/Carbon Monoxide  detectors- These detectors need to be installed on the appropriate level of your home. Replace batteries at least once a year. 7. Skin cancer- When out in the sun please cover up and use sunscreen 15 SPF or higher. 8. Violence- If anyone is threatening or hurting you, please tell your healthcare provider.  9. Drink alcohol in moderation- Limit alcohol intake to one drink or less per day. Never drink and drive.  Please be sure medication list is accurate. If a new problem present, please set up appointment sooner than planned today.

## 2019-06-19 NOTE — Progress Notes (Signed)
HPI:  [name] is a 38 y.o.male here today for his routine physical examination.  Last CPE: None as an adult He lives with his father.  Regular exercise 3 or more times per week: Not consistently,active at work for the past month. Following a healthy diet: Fast food sometimes.  Chronic medical problems: Otherwise healthy but has had some health issue for the past couple months.He has been in the ER/urgent care x 3 since 02/2019.   He is not sexually active. Hx of STD's: Denies.  -Denies high alcohol intake, tobacco use, or Hx of illicit drug use.  -Concerns and/or follow up today:  "Sleep pattern messed up." Sleeping about 6 hours,waking up earlier than planned for the past few weeks.  He has checked temp and no fever. COVID 19 screening negative.  Palpitations, intermittently, they seem to be exacerbated by mild to moderate physical activity. He denies associated chest pain, dyspnea, or diaphoresis. He was in the ER 05/09/19 c/p CP, still having occasional achy chest wall sensation. It is not related with exertion.  He would like referral to cardiologist due to history of "cardiac arrhythmia" during childhood.  Still having episodes of fatigue, intermittently, interfering with work, he has missed some days. So far blood work has been negative. Last visit he was also complaining about numbness, this has resolved. He denies falling asleep while driving, waiting in the waiting room, or having a conversation. He is waking up earlier, sleeping about 6 hours.  Mild hypoK+ with last blood work. He is eating bananas daily.  4/8 and 04/24/19 he was c/o numbness and tingling. B12 ordered,it has not been done. Problem resolved.  COMPREHENSIVE METABOLIC PANEL - Abnormal  Na 138  Potassium 3.5 (*)  Cl 99  CO2 24  Glucose 114 (*)  BUN 10  Creatinine 0.80  Ca 9.2  ALK PHOS 65  T Bili 0.60  Total Protein 7.2  Alb 4.5  GLOBULIN 2.7  ALBUMIN/GLOBULIN RATIO 1.7   BUN/CREAT RATIO 12.5  ALT 12  AST 15  GFR AFRICAN AMERICAN 134    Review of Systems  Constitutional: Positive for activity change and fatigue. Negative for appetite change, fever and unexpected weight change.  HENT: Negative for dental problem, nosebleeds, sore throat, trouble swallowing and voice change.   Eyes: Negative for redness and visual disturbance.  Respiratory: Negative for apnea, cough, shortness of breath and wheezing.   Cardiovascular: Negative for chest pain, palpitations and leg swelling.  Gastrointestinal: Negative for abdominal pain, blood in stool, nausea and vomiting.  Endocrine: Negative for cold intolerance, heat intolerance, polydipsia, polyphagia and polyuria.  Genitourinary: Negative for decreased urine volume, dysuria, genital sores, hematuria and testicular pain.  Musculoskeletal: Negative for back pain, gait problem and joint swelling.  Skin: Negative for color change and rash.  Allergic/Immunologic: Positive for environmental allergies.  Neurological: Negative for syncope, weakness and headaches.  Hematological: Negative for adenopathy. Does not bruise/bleed easily.  Psychiatric/Behavioral: Negative for confusion. The patient is nervous/anxious.   All other systems reviewed and are negative.   Current Outpatient Medications on File Prior to Visit  Medication Sig Dispense Refill  . diphenhydrAMINE (ALLERGY RELIEF) 25 MG tablet Take 25 mg by mouth every 6 (six) hours as needed.     No current facility-administered medications on file prior to visit.      Past Medical History:  Diagnosis Date  . Cardiac arrhythmia, unspecified     History reviewed. No pertinent surgical history.  No Known Allergies  Family History  Problem Relation Age of Onset  . Diabetes Mother   . Hypertension Father     Social History   Socioeconomic History  . Marital status: Single    Spouse name: Not on file  . Number of children: Not on file  . Years of education:  Not on file  . Highest education level: Not on file  Occupational History  . Not on file  Social Needs  . Financial resource strain: Not on file  . Food insecurity    Worry: Not on file    Inability: Not on file  . Transportation needs    Medical: Not on file    Non-medical: Not on file  Tobacco Use  . Smoking status: Never Smoker  . Smokeless tobacco: Never Used  Substance and Sexual Activity  . Alcohol use: Not on file  . Drug use: Never  . Sexual activity: Not on file  Lifestyle  . Physical activity    Days per week: Not on file    Minutes per session: Not on file  . Stress: Not on file  Relationships  . Social Herbalist on phone: Not on file    Gets together: Not on file    Attends religious service: Not on file    Active member of club or organization: Not on file    Attends meetings of clubs or organizations: Not on file    Relationship status: Not on file  Other Topics Concern  . Not on file  Social History Narrative  . Not on file     Vitals:   06/19/19 0833  BP: 116/74  Pulse: 90  Resp: 12  Temp: 98.5 F (36.9 C)  SpO2: 96%   Body mass index is 17.38 kg/m.   Wt Readings from Last 3 Encounters:  06/19/19 101 lb 4 oz (45.9 kg)    Physical Exam  Nursing note and vitals reviewed. Constitutional: He is oriented to person, place, and time. He appears well-developed. No distress.  Under wt.  HENT:  Head: Normocephalic and atraumatic.  Right Ear: Tympanic membrane, external ear and ear canal normal.  Left Ear: Tympanic membrane, external ear and ear canal normal.  Mouth/Throat: Oropharynx is clear and moist and mucous membranes are normal.  Eyes: Pupils are equal, round, and reactive to light. Conjunctivae and EOM are normal.  Neck: Normal range of motion. No tracheal deviation present. No thyromegaly present.  Cardiovascular: Normal rate and regular rhythm.  No murmur heard. Pulses:      Dorsalis pedis pulses are 2+ on the right side  and 2+ on the left side.  Respiratory: Effort normal and breath sounds normal. No respiratory distress.  GI: Soft. He exhibits no mass. There is no hepatomegaly. There is no abdominal tenderness.  Genitourinary:    Genitourinary Comments: No concerns.   Musculoskeletal:        General: No tenderness or edema.     Comments: No major deformities appreciated and no signs of synovitis.  Lymphadenopathy:    He has no cervical adenopathy.       Right: No supraclavicular adenopathy present.       Left: No supraclavicular adenopathy present.  Neurological: He is alert and oriented to person, place, and time. He has normal strength. No cranial nerve deficit or sensory deficit. Coordination and gait normal.  Reflex Scores:      Bicep reflexes are 2+ on the right side and 2+ on the left side.  Patellar reflexes are 2+ on the right side and 2+ on the left side. Skin: Skin is warm. No erythema.  Psychiatric: His mood appears anxious. Cognition and memory are normal.  Fairly groomed,good eye contact.    ASSESSMENT AND PLAN:  Mr. Littleton was seen today for annual exam.  Diagnoses and all orders for this visit:  Orders Placed This Encounter  Procedures  . Tdap vaccine greater than or equal to 7yo IM  . Hemoglobin A1c  . TSH  . Lipid panel  . Vitamin B12  . Potassium  . LDL cholesterol, direct  . Ambulatory referral to Cardiology   Lab Results  Component Value Date   CHOL 217 (H) 06/19/2019   HDL 43.10 06/19/2019   LDLDIRECT 159.0 06/19/2019   TRIG 205.0 (H) 06/19/2019   CHOLHDL 5 06/19/2019   Lab Results  Component Value Date   [id number] 098 06/19/2019   Lab Results  Component Value Date   HGBA1C 5.1 06/19/2019    Lab Results  Component Value Date   TSH 1.29 06/19/2019    Routine general medical examination at a health care facility We discussed the importance of regular physical activity and healthy diet for prevention of chronic illness and/or complications.  Preventive guidelines reviewed. Vaccination updated. Next CPE in a year.  Need for Tdap vaccination -     Tdap vaccine greater than or equal to 7yo IM  Skin sensation disturbance Resolved. F/U as needed.  -     TSH -     Vitamin B12  Hypokalemia Continue K+ rich diet for now. Further recommendations will be given according to lab results.  -     Potassium  Palpitations Persistent. Hx doe snot suggest a serious problem and I do not find abnormalities on auscultation. EKG done in the ER, 04/29/19,otherwise negate.. Instructed about warning signs. Cardiology referral placed.  -     TSH -     Ambulatory referral to Cardiology  Screening for lipoid disorders -     Lipid panel -     LDL cholesterol, direct  Screening for endocrine, metabolic and immunity disorder -     Hemoglobin A1c  Other fatigue We discussed possible etiologies: Systemic illness, immunologic,endocrinology,sleep disorder, psychiatric/psychologic, infectious,medications side effects, and idiopathic. Examination today does not suggest a serious process. Healthy diet and regular physical activity may help.  So far work up has been negative. Denies abnormal wt loss,BMI is his normal.  Further recommendations will be given according to lab results.  -     Hemoglobin A1c -     TSH  Return in 1 year (on 06/18/2020) for CPE, before if symtoms persist or serious lab abnormalities..    Chioke Noxon G. Martinique, MD  [facility]. [place] office.

## 2019-06-20 ENCOUNTER — Encounter: Payer: Self-pay | Admitting: Family Medicine

## 2019-07-03 ENCOUNTER — Ambulatory Visit (INDEPENDENT_AMBULATORY_CARE_PROVIDER_SITE_OTHER): Payer: No Typology Code available for payment source | Admitting: Family Medicine

## 2019-07-03 ENCOUNTER — Encounter: Payer: Self-pay | Admitting: Family Medicine

## 2019-07-03 DIAGNOSIS — Z20828 Contact with and (suspected) exposure to other viral communicable diseases: Secondary | ICD-10-CM | POA: Diagnosis not present

## 2019-07-03 DIAGNOSIS — R51 Headache: Secondary | ICD-10-CM | POA: Diagnosis not present

## 2019-07-03 DIAGNOSIS — B9789 Other viral agents as the cause of diseases classified elsewhere: Secondary | ICD-10-CM

## 2019-07-03 DIAGNOSIS — Z20822 Contact with and (suspected) exposure to covid-19: Secondary | ICD-10-CM

## 2019-07-03 DIAGNOSIS — M791 Myalgia, unspecified site: Secondary | ICD-10-CM

## 2019-07-03 DIAGNOSIS — R070 Pain in throat: Secondary | ICD-10-CM | POA: Diagnosis not present

## 2019-07-03 DIAGNOSIS — J069 Acute upper respiratory infection, unspecified: Secondary | ICD-10-CM

## 2019-07-03 NOTE — Progress Notes (Signed)
Virtual Visit via Video Note  I connected with Joel Thompson on 07/03/19 at  2:30 PM EDT by a video enabled telemedicine application and verified that I am speaking with the correct person using two identifiers.  Location patient: home Location provider:work or home office Persons participating in the virtual visit: patient, provider  I discussed the limitations of evaluation and management by telemedicine and the availability of in person appointments. The patient expressed understanding and agreed to proceed.   HPI: Pt had body aches, HA, and scratchy throat on Wednesday.  Yesterdya pt's throat started burning.  He was sent for COVID testing yesterday by [facility].  Since, pt has developed an elevated Temp 99.5 F.  Took Tylenol with little relief.  Also with intermittent rhinorrhea and cough.  Pt states pO2 has been 96-98%.  Denies ear pain or pressure, n/v, diarrhea, decreased appetite, loss of taste or smell.  Pt works for [employer].  ROS: See pertinent positives and negatives per HPI.  Past Medical History:  Diagnosis Date  . Cardiac arrhythmia, unspecified     No past surgical history on file.  Family History  Problem Relation Age of Onset  . Diabetes Mother   . Hypertension Father      Current Outpatient Medications:  .  diphenhydrAMINE (ALLERGY RELIEF) 25 MG tablet, Take 25 mg by mouth every 6 (six) hours as needed., Disp: , Rfl:   EXAM:  VITALS per patient if applicable: pO2 29-92%  Temp 99.21F  RR between 12-20 bpm  GENERAL: alert, oriented, appears well and in no acute distress  HEENT: atraumatic, conjunctiva clear, no obvious abnormalities on inspection of external nose and ears  NECK: normal movements of the head and neck  LUNGS: on inspection no signs of respiratory distress, breathing rate appears normal, no obvious gross SOB, gasping or wheezing  CV: no obvious cyanosis  MS: moves all visible extremities without noticeable  abnormality  PSYCH/NEURO: pleasant and cooperative, no obvious depression or anxiety, speech and thought processing grossly intact  ASSESSMENT AND PLAN:  Discussed the following assessment and plan:  Viral URI with cough -supportive care -advised to self quarantine while awaiting COVID-19 results  Suspected Covid-19 Virus Infection  -test results pending -self quarantine -given precautions  F/prn   I discussed the assessment and treatment plan with the patient. The patient was provided an opportunity to ask questions and all were answered. The patient agreed with the plan and demonstrated an understanding of the instructions.   The patient was advised to call back or seek an in-person evaluation if the symptoms worsen or if the condition fails to improve as anticipated.   Billie Ruddy, MD

## 2019-07-17 ENCOUNTER — Other Ambulatory Visit: Payer: Self-pay

## 2019-07-17 ENCOUNTER — Telehealth (INDEPENDENT_AMBULATORY_CARE_PROVIDER_SITE_OTHER): Payer: No Typology Code available for payment source | Admitting: Family Medicine

## 2019-07-17 DIAGNOSIS — R519 Headache, unspecified: Secondary | ICD-10-CM

## 2019-07-17 DIAGNOSIS — M545 Low back pain, unspecified: Secondary | ICD-10-CM

## 2019-07-17 DIAGNOSIS — R51 Headache: Secondary | ICD-10-CM | POA: Diagnosis not present

## 2019-07-17 DIAGNOSIS — R0789 Other chest pain: Secondary | ICD-10-CM

## 2019-07-17 DIAGNOSIS — R6889 Other general symptoms and signs: Secondary | ICD-10-CM

## 2019-07-17 DIAGNOSIS — G4700 Insomnia, unspecified: Secondary | ICD-10-CM

## 2019-07-17 DIAGNOSIS — M549 Dorsalgia, unspecified: Secondary | ICD-10-CM | POA: Diagnosis not present

## 2019-07-17 MED ORDER — CYCLOBENZAPRINE HCL 10 MG PO TABS: 5.0000 mg | ORAL_TABLET | Freq: Three times a day (TID) | ORAL | 0 refills | Status: AC | PRN

## 2019-07-17 NOTE — Progress Notes (Signed)
Virtual Visit via Video Note   I connected with [name] on 07/17/19 by a video enabled telemedicine application and verified that I am speaking with the correct person using two identifiers.  Location patient: home Location provider:work office Persons participating in the virtual visit: patient, provider  I discussed the limitations of evaluation and management by telemedicine and the availability of in person appointments. The patient expressed understanding and agreed to proceed.   HPI:  [name] is a 38 yo male who has a few concerns today,some have been addressed in the past. Since his last OV (06/19/19) he has seen for URI, 07/03/19.  -C/O scratchy throat and headache 2 weeks ago. He wonders if he needs to be checked for COVID 19. He has had 3 COVID 19 testing and negative. Throat discomfort has improved.  Denies body aches, fever,or chills.  -Righ-sided upper and lower back pain. 5-6/10 achy pain.  Pain started about a month ago,it is "recurrent." It happens 2 times per week. No Hx of trauma or unusual activity. He has not noted local rash or edema. Pain is not radiated to extremities.  He is not sure about exacerbatingfactors.  Denies saddle anesthesia,urine/bowel incontinence.  -Still having headache,started parietal a month ago. He denies prior Hx. Right temporal (2 weeks ago) and parietal pressure like headache. Intermittent, 5/10. No associated visual changes,N/V,or focal deficit. He has taken tylenol He is not longer having numbness or tingling on upper extremities.  He has not identified exacerbating or alleviating factors.  Still having chest discomfort, for which ha has been evaluated before and some work-up has been done. + Palpitations. Evaluated in the ER on 05/09/19.  Pending appt with cardiologist.  Symptoms are not related with exertion. No associated diaphoresis,coughmwheezing,or dyspnea.  Also c/o difficulty sleeping. He has not tried  OTC sleep aids. Problem started about a months ago. He drinks coffee in the morning. Trouble staying asleep. Waking up around 3 am and not able to go back to sleep.  ROS: See pertinent positives and negatives per HPI.  Past Medical History:  Diagnosis Date  . Cardiac arrhythmia, unspecified     No past surgical history on file.  Family History  Problem Relation Age of Onset  . Diabetes Mother   . Hypertension Father     Social History   Socioeconomic History  . Marital status: Single    Spouse name: Not on file  . Number of children: Not on file  . Years of education: Not on file  . Highest education level: Not on file  Occupational History  . Not on file  Social Needs  . Financial resource strain: Not on file  . Food insecurity    Worry: Not on file    Inability: Not on file  . Transportation needs    Medical: Not on file    Non-medical: Not on file  Tobacco Use  . Smoking status: Never Smoker  . Smokeless tobacco: Never Used  Substance and Sexual Activity  . Alcohol use: Not on file  . Drug use: Never  . Sexual activity: Not on file  Lifestyle  . Physical activity    Days per week: Not on file    Minutes per session: Not on file  . Stress: Not on file  Relationships  . Social Musicianconnections    Talks on phone: Not on file    Gets together: Not on file    Attends religious service: Not on file    Active member of  club or organization: Not on file    Attends meetings of clubs or organizations: Not on file    Relationship status: Not on file  . Intimate partner violence    Fear of current or ex partner: Not on file    Emotionally abused: Not on file    Physically abused: Not on file    Forced sexual activity: Not on file  Other Topics Concern  . Not on file  Social History Narrative  . Not on file      Current Outpatient Medications:  .  cyclobenzaprine (FLEXERIL) 10 MG tablet, Take 0.5-1 tablets (5-10 mg total) by mouth 3 (three) times daily as  needed for muscle spasms., Disp: 30 tablet, Rfl: 0 .  diphenhydrAMINE (ALLERGY RELIEF) 25 MG tablet, Take 25 mg by mouth every 6 (six) hours as needed., Disp: , Rfl:   EXAM:  VITALS per patient if applicable:N/A  GENERAL: alert, oriented, appears well and in no acute distress  HEENT: atraumatic, conjunctiva clear, no obvious abnormalities on inspection of external nose and ears  NECK: normal movements of the head and neck  LUNGS: on inspection no signs of respiratory distress, breathing rate appears normal, no obvious gross SOB, gasping or wheezing  CV: no obvious cyanosis  MS: moves all visible extremities without noticeable abnormality. Pain is not exacerbated bu upper extremities elevation.  PSYCH/NEURO: pleasant and cooperative, no obvious depression, + anxious. Speech and thought processing grossly intact  ASSESSMENT AND PLAN:  Discussed the following assessment and plan:  Headache, unspecified headache type - Plan: ? Tension headache. Flexeril may help. I do not think imaging is needed today. A better sleep may also help. Instructed about warning signs.  Insomnia, unspecified type - Plan: We discussed treatment options,OTC and prescription. He prefers to try Melatonin before prescription medication. Good sleep hygiene.  Upper back pain on right side - Plan: cyclobenzaprine (FLEXERIL) 10 MG tablet. I do not think imaging is needed at this time. Local icy hot or asper cream may help. Flexeril side effects discussed.  Right-sided low back pain without sciatica, unspecified chronicity - Plan: cyclobenzaprine (FLEXERIL) 10 MG tablet Hx does not suggest a serious process. Instructed about warning signs. Flexeril 5-10 mg tid as needed,if he needs to drive, just take it at night.  Multiple complaints - Plan: He has had 10 visit,one was for CPE but also has concerns. ? Anxiety. He acknowledges he has been under some stress at work. 05/09/19 normal troponin,CK,and  CKMB. Other chest pain - Plan: He had CXR x 2 (5/30 and 05/25/19) Instructed about warning signs. ?Musculoskeletal.  Keep appt with cardiologist.   I discussed the assessment and treatment plan with the patient. He was provided an opportunity to ask questions and all were answered. Heagreed with the plan and demonstrated an understanding of the instructions.   The patient was advised to call back or seek an in-person evaluation if the symptoms worsen or if the condition fails to improve as anticipated.  Follow up in 4 weeks,before if needed.    Charma Mocarski [place], MD

## 2019-08-14 ENCOUNTER — Ambulatory Visit: Payer: No Typology Code available for payment source | Admitting: Family Medicine

## 2019-08-18 ENCOUNTER — Ambulatory Visit (INDEPENDENT_AMBULATORY_CARE_PROVIDER_SITE_OTHER): Payer: No Typology Code available for payment source | Admitting: Family Medicine

## 2019-08-18 ENCOUNTER — Encounter: Payer: Self-pay | Admitting: Family Medicine

## 2019-08-18 ENCOUNTER — Other Ambulatory Visit: Payer: Self-pay

## 2019-08-18 VITALS — BP 112/78 | HR 81 | Temp 98.7°F | Resp 12 | Ht 64.0 in | Wt 109.2 lb

## 2019-08-18 DIAGNOSIS — R519 Headache, unspecified: Secondary | ICD-10-CM

## 2019-08-18 DIAGNOSIS — R002 Palpitations: Secondary | ICD-10-CM

## 2019-08-18 DIAGNOSIS — G250 Essential tremor: Secondary | ICD-10-CM

## 2019-08-18 DIAGNOSIS — G4700 Insomnia, unspecified: Secondary | ICD-10-CM | POA: Diagnosis not present

## 2019-08-18 DIAGNOSIS — R51 Headache: Secondary | ICD-10-CM

## 2019-08-18 DIAGNOSIS — J309 Allergic rhinitis, unspecified: Secondary | ICD-10-CM

## 2019-08-18 DIAGNOSIS — M549 Dorsalgia, unspecified: Secondary | ICD-10-CM | POA: Diagnosis not present

## 2019-08-18 MED ORDER — FLUTICASONE PROPIONATE 50 MCG/ACT NA SUSP: 2.0000 | Freq: Every evening | NASAL | 3 refills | Status: DC | PRN

## 2019-08-18 MED ORDER — DOXEPIN HCL 10 MG/ML PO CONC: 3.0000 mg | Freq: Every day | ORAL | 2 refills | Status: DC

## 2019-08-18 MED FILL — DOXEPIN 10 MG/ML ORAL CONC: 10 | 30 days supply | Qty: 30 | Fill #0

## 2019-08-18 MED FILL — FLUTICASONE PROP 50 MCG SPR: 50 | 30 days supply | Qty: 16 | Fill #0

## 2019-08-18 NOTE — Progress Notes (Signed)
HPI:   [name] is a 38 y.o. male, who is here today to follow on recent OV. He was last seen on 07/17/2019, when he was complaining about headaches and right upper back pain. Both problems have improved. He states that he is having "milder" headaches and not as frequent, from 0-2 episodes per week. No associated visual changes, nausea, vomiting, or focal neurologic deficit.  He has not had back pain since his last OV.  Palpitation, he has not had any episodes since his last visit. He was supposed to have an appointment to see cardiologist, states that he has not received information about appointment. He has a reported history of cardiac arrhythmia during childhood. He has not noted chest pain, dyspnea, diaphoresis, or cyanosis.   Lab Results  Component Value Date   TSH 1.29 06/19/2019   Insomnia has not improved with melatonin. Having difficulty falling asleep and staying asleep. He wakes up a couple times through the night, sometimes he has difficulty going back to sleep. States that he wakes up with minimal noise. Sleeping about 4 to 7 hours.  He has not identified exacerbating factors. Sometimes he falls asleep easier when he drinks chamomile.   Nasal congestion and "scratchy" throat in the morning. Negative for eye drainage, eye or nose pruritus, or cough. + Postnasal drainage. He denies associated fever, chills, changes in appetite, body aches, or unusual fatigue. No sick contact or recent travel. He has not tried OTC medications.  During prior visits I have noted slight head tremor,today I asked about this. He states that he has had it for long time,stable. Is not sure about exacerbating or alleviating factors. This is not affecting his daily activities. He is not aware of congenital abnormalities or FHx of tremor.  Review of Systems  Constitutional: Negative for activity change and unexpected weight change.  HENT: Negative for mouth sores and  nosebleeds.   Respiratory: Negative for wheezing.   Cardiovascular: Negative for leg swelling.  Gastrointestinal: Negative for abdominal pain.       No changes in bowel habits.  Endocrine: Negative for cold intolerance and heat intolerance.  Musculoskeletal: Negative for arthralgias and myalgias.  Skin: Negative for pallor and rash.  Neurological: Negative for syncope and facial asymmetry.  Hematological: Negative for adenopathy. Does not bruise/bleed easily.  Psychiatric/Behavioral: Negative for confusion. The patient is nervous/anxious.   Rest see pertinent positives and negatives per HPI.   Current Outpatient Medications on File Prior to Visit  Medication Sig Dispense Refill  . diphenhydrAMINE (ALLERGY RELIEF) 25 MG tablet Take 25 mg by mouth every 6 (six) hours as needed.     No current facility-administered medications on file prior to visit.      Past Medical History:  Diagnosis Date  . Cardiac arrhythmia, unspecified    No Known Allergies  Social History   Socioeconomic History  . Marital status: Single    Spouse name: Not on file  . Number of children: Not on file  . Years of education: Not on file  . Highest education level: Not on file  Occupational History  . Not on file  Social Needs  . Financial resource strain: Not on file  . Food insecurity    Worry: Not on file    Inability: Not on file  . Transportation needs    Medical: Not on file    Non-medical: Not on file  Tobacco Use  . Smoking status: Never Smoker  . Smokeless tobacco: Never  Used  Substance and Sexual Activity  . Alcohol use: Not on file  . Drug use: Never  . Sexual activity: Not on file  Lifestyle  . Physical activity    Days per week: Not on file    Minutes per session: Not on file  . Stress: Not on file  Relationships  . Social connections   Musician Talks on phone: Not on file    Gets together: Not on file    Attends religious service: Not on file    Active member of club or  organization: Not on file    Attends meetings of clubs or organizations: Not on file    Relationship status: Not on file  Other Topics Concern  . Not on file  Social History Narrative  . Not on file    Vitals:   08/18/19 1059  BP: 112/78  Pulse: 81  Resp: 12  Temp: 98.7 F (37.1 C)  SpO2: 96%   Body mass index is 18.74 kg/m.   Physical Exam  Nursing note reviewed. Constitutional: He is oriented to person, place, and time. He appears well-developed. No distress.  HENT:  Head: Normocephalic and atraumatic.  Mouth/Throat: Oropharynx is clear and moist and mucous membranes are normal.  Eyes: Pupils are equal, round, and reactive to light. Conjunctivae are normal.  Cardiovascular: Normal rate and regular rhythm.  No murmur heard. Pulses:      Dorsalis pedis pulses are 2+ on the right side and 2+ on the left side.  Respiratory: Effort normal and breath sounds normal. No respiratory distress.  GI: Soft. He exhibits no mass. There is no hepatomegaly. There is no abdominal tenderness.  Musculoskeletal:        General: No edema.  Lymphadenopathy:    He has no cervical adenopathy.  Neurological: He is alert and oriented to person, place, and time. He has normal strength. No cranial nerve deficit. Gait normal.  Slight head tremor,intermitent.  Skin: Skin is warm. No rash noted. No erythema.  Psychiatric: His mood appears anxious.  Well groomed, good eye contact.    ASSESSMENT AND PLAN:  [name] was seen today for follow-up.  Diagnoses and all orders for this visit:  Insomnia, unspecified type After discussing pharmacologic treatments available, he agrees with trying doxepin, starting with 3 mg at bedtime, he can titrate dose up to 6 and 10 mg if needed. Good sleep hygiene. We discussed subscapular medication. Follow-up in 3 months, before if needed.  -     doxepin (SINEQUAN) 10 MG/ML solution; Take 0.3-1 mLs (3-10 mg total) by mouth at bedtime.  Headache,  unspecified headache type Great improvement. I do not think brain imaging is needed at this time. Instructed about warning signs.  Upper back pain on right side Problem has resolved. Follow-up as needed.  Palpitations He has not had any episodes since his last visit. Pending appointment with cardiologist. ?  Anxiety. Instructed about warning signs.  Allergic rhinitis, unspecified seasonality, unspecified trigger This problem could aggravate insomnia. Recommend intranasal Flonase at bedtime. He is not having symptoms during the day, so we can hold on antihistaminic.  -     fluticasone (FLONASE) 50 MCG/ACT nasal spray; Place 2 sprays into both nostrils at bedtime as needed for allergies or rhinitis.  Benign head tremor Most likely benign. He has had labs done due to other problems including CBC,BPM,TSH,HgA1C,B12,D Dimer,LFT's; so I do not think blood work is needed today.  We discussed other possible etiologies. We will continue monitoring.  Return in about 3 months (around 11/17/2019) for insomnia.   Betty G. [place], MD  [facility] [place] office.

## 2019-08-18 NOTE — Patient Instructions (Addendum)
A few things to remember from today's visit:   Insomnia, unspecified type - Plan: doxepin (SINEQUAN) 10 MG/ML solution  Headache, unspecified headache type  Upper back pain on right side  Palpitations  Even though palpitations have resolved I recommend keeping appointment with cardiologist. Somebody supposed to call you from cardiologist office. For back pain you can use over-the-counter IcyHot or Aspercreme. Dosing pain is a medication to help you sleep, you can start with 3 mg, increase to 6 mg in 10 days and to 10 mg if needed.  Insomnia Insomnia is a sleep disorder that makes it difficult to fall asleep or stay asleep. Insomnia can cause fatigue, low energy, difficulty concentrating, mood swings, and poor performance at work or school. There are three different ways to classify insomnia:  Difficulty falling asleep.  Difficulty staying asleep.  Waking up too early in the morning. Any type of insomnia can be long-term (chronic) or short-term (acute). Both are common. Short-term insomnia usually lasts for three months or less. Chronic insomnia occurs at least three times a week for longer than three months. What are the causes? Insomnia may be caused by another condition, situation, or substance, such as:  Anxiety.  Certain medicines.  Gastroesophageal reflux disease (GERD) or other gastrointestinal conditions.  Asthma or other breathing conditions.  Restless legs syndrome, sleep apnea, or other sleep disorders.  Chronic pain.  Menopause.  Stroke.  Abuse of alcohol, tobacco, or illegal drugs.  Mental health conditions, such as depression.  Caffeine.  Neurological disorders, such as Alzheimer's disease.  An overactive thyroid (hyperthyroidism). Sometimes, the cause of insomnia may not be known. What increases the risk? Risk factors for insomnia include:  Gender. Women are affected more often than men.  Age. Insomnia is more common as you get older.   Stress.  Lack of exercise.  Irregular work schedule or working night shifts.  Traveling between different time zones.  Certain medical and mental health conditions. What are the signs or symptoms? If you have insomnia, the main symptom is having trouble falling asleep or having trouble staying asleep. This may lead to other symptoms, such as:  Feeling fatigued or having low energy.  Feeling nervous about going to sleep.  Not feeling rested in the morning.  Having trouble concentrating.  Feeling irritable, anxious, or depressed. How is this diagnosed? This condition may be diagnosed based on:  Your symptoms and medical history. Your health care provider may ask about: ? Your sleep habits. ? Any medical conditions you have. ? Your mental health.  A physical exam. How is this treated? Treatment for insomnia depends on the cause. Treatment may focus on treating an underlying condition that is causing insomnia. Treatment may also include:  Medicines to help you sleep.  Counseling or therapy.  Lifestyle adjustments to help you sleep better. Follow these instructions at home: Eating and drinking   Limit or avoid alcohol, caffeinated beverages, and cigarettes, especially close to bedtime. These can disrupt your sleep.  Do not eat a large meal or eat spicy foods right before bedtime. This can lead to digestive discomfort that can make it hard for you to sleep. Sleep habits   Keep a sleep diary to help you and your health care provider figure out what could be causing your insomnia. Write down: ? When you sleep. ? When you wake up during the night. ? How well you sleep. ? How rested you feel the next day. ? Any side effects of medicines you are taking. ?  What you eat and drink.  Make your bedroom a dark, comfortable place where it is easy to fall asleep. ? Put up shades or blackout curtains to block light from outside. ? Use a white noise machine to block noise. ?  Keep the temperature cool.  Limit screen use before bedtime. This includes: ? Watching TV. ? Using your smartphone, tablet, or computer.  Stick to a routine that includes going to bed and waking up at the same times every day and night. This can help you fall asleep faster. Consider making a quiet activity, such as reading, part of your nighttime routine.  Try to avoid taking naps during the day so that you sleep better at night.  Get out of bed if you are still awake after 15 minutes of trying to sleep. Keep the lights down, but try reading or doing a quiet activity. When you feel sleepy, go back to bed. General instructions  Take over-the-counter and prescription medicines only as told by your health care provider.  Exercise regularly, as told by your health care provider. Avoid exercise starting several hours before bedtime.  Use relaxation techniques to manage stress. Ask your health care provider to suggest some techniques that may work well for you. These may include: ? Breathing exercises. ? Routines to release muscle tension. ? Visualizing peaceful scenes.  Make sure that you drive carefully. Avoid driving if you feel very sleepy.  Keep all follow-up visits as told by your health care provider. This is important. Contact a health care provider if:  You are tired throughout the day.  You have trouble in your daily routine due to sleepiness.  You continue to have sleep problems, or your sleep problems get worse. Get help right away if:  You have serious thoughts about hurting yourself or someone else. If you ever feel like you may hurt yourself or others, or have thoughts about taking your own life, get help right away. You can go to your nearest emergency department or call:  Your local emergency services (911 in the [place]  A suicide crisis helpline, such as the [facility] at 519-426-4935. This is open 24 hours a day. Summary  Insomnia is  a sleep disorder that makes it difficult to fall asleep or stay asleep.  Insomnia can be long-term (chronic) or short-term (acute).  Treatment for insomnia depends on the cause. Treatment may focus on treating an underlying condition that is causing insomnia.  Keep a sleep diary to help you and your health care provider figure out what could be causing your insomnia. This information is not intended to replace advice given to you by your health care provider. Make sure you discuss any questions you have with your health care provider. Document Released: 11/23/2000 Document Revised: 11/08/2017 Document Reviewed: 09/05/2017 Elsevier Patient Education  2020 [employer].

## 2019-10-06 ENCOUNTER — Ambulatory Visit (INDEPENDENT_AMBULATORY_CARE_PROVIDER_SITE_OTHER): Payer: No Typology Code available for payment source | Admitting: Cardiovascular Disease

## 2019-10-06 ENCOUNTER — Other Ambulatory Visit: Payer: Self-pay

## 2019-10-06 ENCOUNTER — Encounter: Payer: Self-pay | Admitting: Cardiovascular Disease

## 2019-10-06 DIAGNOSIS — R0789 Other chest pain: Secondary | ICD-10-CM

## 2019-10-06 DIAGNOSIS — R002 Palpitations: Secondary | ICD-10-CM

## 2019-10-06 DIAGNOSIS — R079 Chest pain, unspecified: Secondary | ICD-10-CM | POA: Insufficient documentation

## 2019-10-06 NOTE — Assessment & Plan Note (Signed)
Joel Thompson had an episode of atypical chest pain was evaluated at [facility].  Etiology was undetermined.  He has no risk factors and I suspect this was nonischemic.

## 2019-10-06 NOTE — Progress Notes (Signed)
10/06/2019 Joel Thompson   1981-10-09  [id number]  Primary Physician Thompson, Joel G, MD Primary Cardiologist: Lorretta Harp MD Joel Thompson, [place]  HPI:  Joel Thompson is a 38 y.o. 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  He was referred by his PCP, Joel Thompson, for cardiac evaluation because of rare/infrequent palpitations.  He has no cardiac risk factors.  He was seen at [facility] earlier this year with atypical chest pain thought not to be ischemically mediated.  He had episode of tachypalpitations in July which was self-limited.  He said no recurrent episodes.  During these episodes he had no other symptoms such as dizziness or loss of consciousness.  He drinks 1 cup of coffee a day.   Current Meds  Medication Sig  . diphenhydrAMINE (ALLERGY RELIEF) 25 MG tablet Take 25 mg by mouth every 6 (six) hours as needed.  . doxepin (SINEQUAN) 10 MG/ML solution Take 0.3-1 mLs (3-10 mg total) by mouth at bedtime.  . fluticasone (FLONASE) 50 MCG/ACT nasal spray Place 2 sprays into both nostrils at bedtime as needed for allergies or rhinitis.     No Known Allergies  Social History   Socioeconomic History  . Marital status: Single    Spouse name: Not on file  . Number of children: Not on file  . Years of education: Not on file  . Highest education level: Not on file  Occupational History  . Not on file  Social Needs  . Financial resource strain: Not on file  . Food insecurity    Worry: Not on file    Inability: Not on file  . Transportation needs    Medical: Not on file    Non-medical: Not on file  Tobacco Use  . Smoking status: Never Smoker  . Smokeless tobacco: Never Used  Substance and Sexual Activity  . Alcohol use: Not on file  . Drug use: Never  . Sexual activity: Not on file  Lifestyle  . Physical activity    Days per week: Not on file    Minutes per session: Not on file  . Stress: Not on  file  Relationships  . Social Herbalist on phone: Not on file    Gets together: Not on file    Attends religious service: Not on file    Active member of club or organization: Not on file    Attends meetings of clubs or organizations: Not on file    Relationship status: Not on file  . Intimate partner violence    Fear of current or ex partner: Not on file    Emotionally abused: Not on file    Physically abused: Not on file    Forced sexual activity: Not on file  Other Topics Concern  . Not on file  Social History Narrative  . Not on file     Review of Systems: General: negative for chills, fever, night sweats or weight changes.  Cardiovascular: negative for chest pain, dyspnea on exertion, edema, orthopnea, palpitations, paroxysmal nocturnal dyspnea or shortness of breath Dermatological: negative for rash Respiratory: negative for cough or wheezing Urologic: negative for hematuria Abdominal: negative for nausea, vomiting, diarrhea, bright red blood per rectum, melena, or hematemesis Neurologic: negative for visual changes, syncope, or dizziness All other systems reviewed and are otherwise negative except as noted above.    Blood pressure 122/84, pulse 97, height 5\' 4"  (1.626  m), weight 109 lb 6.4 oz (49.6 kg), SpO2 97 %.  General appearance: alert and no distress Neck: no adenopathy, no carotid bruit, no JVD, supple, symmetrical, trachea midline and thyroid not enlarged, symmetric, no tenderness/mass/nodules Lungs: clear to auscultation bilaterally Heart: Late systolic click consistent with mitral valve prolapse Extremities: extremities normal, atraumatic, no cyanosis or edema Pulses: 2+ and symmetric Skin: Skin color, texture, turgor normal. No rashes or lesions Neurologic: Alert and oriented X 3, normal strength and tone. Normal symmetric reflexes. Normal coordination and gait  EKG sinus rhythm at 97 without ST or T wave changes.  Personally reviewed this EKG.   ASSESSMENT AND PLAN:   Palpitations EJ was referred to me by Dr. Swaz[place] for palpitations.  He apparently had some arrhythmia in childhood.  He gets rare palpitations the last episode being in July.  He is not really sure it was cardiac related.  It was self-limited.  He said no recurrent symptoms.  No further work-up required at this time  Atypical chest pain EJ had an episode of atypical chest pain was evaluated at [facility].  Etiology was undetermined.  He has no risk factors and I suspect this was nonischemic.      Runell Gess MD FACP,FACC,FAHA, [facility] 10/06/2019 3:17 PM

## 2019-10-06 NOTE — Assessment & Plan Note (Signed)
EJ was referred to me by Dr. Martinique for palpitations.  He apparently had some arrhythmia in childhood.  He gets rare palpitations the last episode being in July.  He is not really sure it was cardiac related.  It was self-limited.  He said no recurrent symptoms.  No further work-up required at this time

## 2019-10-06 NOTE — Patient Instructions (Signed)
Medication Instructions:  NONE If you need a refill on your cardiac medications before your next appointment, please call your pharmacy.   Lab work: NONE If you have labs (blood work) drawn today and your tests are completely normal, you will receive your results only by: Marland Kitchen MyChart Message (if you have MyChart) OR . A paper copy in the mail If you have any lab test that is abnormal or we need to change your treatment, we will call you to review the results.  Testing/Procedures: NONE  Follow-Up: At [facility], you and your health needs are our priority.  As part of our continuing mission to provide you with exceptional heart care, we have created designated Provider Care Teams.  These Care Teams include your primary Cardiologist (physician) and Advanced Practice Providers (APPs -  Physician Assistants and Nurse Practitioners) who all work together to provide you with the care you need, when you need it. . You MAY a follow up appointment AS NEEDED.  You may see Dr. Gwenlyn Found or one of the following Advanced Practice Providers on your designated Care Team:   . Kerin Ransom, PA-C . Daleen Snook Kroeger, PA-C . Sande Rives, PA-C

## 2020-06-19 NOTE — Progress Notes (Signed)
HPI: [name] is a 39 y.o.male here today for his routine physical examination.  Last CPE: 06/19/19 He lives with his father.  Regular exercise 3 or more times per week: He is active at work, 6000-7000 steps in [place]. Following a healthy diet: Yes, "in general."  Chronic medical problems: Insomnia,HLD, and palpitations among some.  Hx of STD's: Negative. He is not sexually active.  Immunization History  Administered Date(s) Administered  . Tdap 06/19/2019   -Hep C screening: Never.  Last colon cancer screening: N/A Last prostate ca screening: N/A  -Denies high alcohol intake, tobacco use, or Hx of illicit drug use.  -Concerns and/or follow up today:   HLD: Currently he is on no pharmacologic treatment.  Lab Results  Component Value Date   CHOL 217 (H) 06/19/2019   HDL 43.10 06/19/2019   LDLDIRECT 159.0 06/19/2019   TRIG 205.0 (H) 06/19/2019   CHOLHDL 5 06/19/2019   For about a year right nostril  "blocked" when lying on his back and left nostril when lying on right. + Post nasal drainage. He is not using his Flonase nasal spray. + Snoring "a little bit." Negative for fever,chills,or changes in appetite.  "Problem with breathing" when lying down and sometimes when he wakes up in the morning. No problems during the day. His watch is reporting "breathing abnormalities" while he is asleep.He wonders if his O2 sat id dropping in the middle of the night. No known OSA. He has no respiratory symptoms during the day. No associated chest pain or diaphoresis.  He has seen cardiologist due to palpitations, resolved.  Insomnia: He is not longer on Doxepin, it does not seem to be working any more. He wakes up a few times during the night. Sleeping about 6 hours in average.   Review of Systems  Constitutional: Negative for activity change, appetite change and fever.  HENT: Positive for congestion. Negative for dental problem, ear pain, mouth sores, nosebleeds,  sinus pain, sore throat and trouble swallowing.   Eyes: Negative for redness and visual disturbance.  Respiratory: Negative for cough, shortness of breath and wheezing.   Cardiovascular: Negative for palpitations and leg swelling.  Gastrointestinal: Negative for abdominal pain, nausea and vomiting.       No changes in bowel habits.  Endocrine: Negative for cold intolerance, heat intolerance, polydipsia, polyphagia and polyuria.  Genitourinary: Negative for decreased urine volume, dysuria, genital sores, hematuria and testicular pain.  Musculoskeletal: Negative for gait problem and myalgias.  Skin: Negative for color change and rash.  Allergic/Immunologic: Positive for environmental allergies.  Neurological: Negative for syncope, weakness and headaches.  Hematological: Negative for adenopathy. Does not bruise/bleed easily.  Psychiatric/Behavioral: Positive for sleep disturbance. Negative for confusion. The patient is nervous/anxious.   All other systems reviewed and are negative.  Current Outpatient Medications on File Prior to Visit  Medication Sig Dispense Refill  . diphenhydrAMINE (ALLERGY RELIEF) 25 MG tablet Take 25 mg by mouth every 6 (six) hours as needed.    . doxepin (SINEQUAN) 10 MG/ML solution Take 0.3-1 mLs (3-10 mg total) by mouth at bedtime. 30 mL 2  . fluticasone (FLONASE) 50 MCG/ACT nasal spray Place 2 sprays into both nostrils at bedtime as needed for allergies or rhinitis. 16 g 3   No current facility-administered medications on file prior to visit.   Past Medical History:  Diagnosis Date  . Allergy   . Cardiac arrhythmia, unspecified    History reviewed. No pertinent surgical history.  No Known Allergies  Family History  Problem Relation Age of Onset  . Diabetes Mother   . Hypertension Father     Social History   Socioeconomic History  . Marital status: Single    Spouse name: Not on file  . Number of children: Not on file  . Years of education: Not on  file  . Highest education level: Not on file  Occupational History  . Not on file  Tobacco Use  . Smoking status: Never Smoker  . Smokeless tobacco: Never Used  Substance and Sexual Activity  . Alcohol use: Not on file  . Drug use: Never  . Sexual activity: Not on file  Other Topics Concern  . Not on file  Social History Narrative  . Not on file   Social Determinants of Health   Financial Resource Strain:   . Difficulty of Paying Living Expenses:   Food Insecurity:   . Worried About Programme researcher, broadcasting/film/videounning Out of Food in the Last Year:   . Baristaan Out of Food in the Last Year:   Transportation Needs:   . Freight forwarderLack of Transportation (Medical):   Marland Kitchen. Lack of Transportation (Non-Medical):   Physical Activity:   . Days of Exercise per Week:   . Minutes of Exercise per Session:   Stress:   . Feeling of Stress :   Social Connections:   . Frequency of Communication with Friends and Family:   . Frequency of Social Gatherings with Friends and Family:   . Attends Religious Services:   . Active Member of Clubs or Organizations:   . Attends BankerClub or Organization Meetings:   Marland Kitchen. Marital Status:    Vitals:   06/20/20 0727  BP: 110/70  Pulse: 79  Resp: 12  SpO2: 96%   Body mass index is 19.22 kg/m.  Wt Readings from Last 3 Encounters:  06/20/20 112 lb (50.8 kg)  10/06/19 109 lb 6.4 oz (49.6 kg)  08/18/19 109 lb 3.2 oz (49.5 kg)   Physical Exam Vitals and nursing note reviewed.  Constitutional:      General: He is not in acute distress.    Appearance: He is well-developed, well-groomed and normal weight.  HENT:     Head: Atraumatic.     Right Ear: Tympanic membrane, ear canal and external ear normal.     Left Ear: Tympanic membrane, ear canal and external ear normal.     Nose: Septal deviation present.     Right Turbinates: Enlarged.     Left Turbinates: Enlarged.     Right Sinus: No maxillary sinus tenderness or frontal sinus tenderness.     Left Sinus: No maxillary sinus tenderness or frontal  sinus tenderness.     Mouth/Throat:     Mouth: Mucous membranes are moist.     Pharynx: Oropharynx is clear.  Eyes:     Conjunctiva/sclera: Conjunctivae normal.     Pupils: Pupils are equal, round, and reactive to light.  Neck:     Thyroid: No thyromegaly.     Trachea: No tracheal deviation.  Cardiovascular:     Rate and Rhythm: Normal rate and regular rhythm.     Pulses:          Dorsalis pedis pulses are 2+ on the right side and 2+ on the left side.     Heart sounds: No murmur heard.   Pulmonary:     Effort: Pulmonary effort is normal. No respiratory distress.     Breath sounds: Normal breath sounds.  Abdominal:     Palpations:  Abdomen is soft. There is no hepatomegaly or mass.     Tenderness: There is no abdominal tenderness.  Genitourinary:    Comments: No concerns. Musculoskeletal:        General: No tenderness.     Cervical back: Normal range of motion.     Comments: No major deformities appreciated and no signs of synovitis.  Lymphadenopathy:     Cervical: No cervical adenopathy.     Upper Body:     Right upper body: No supraclavicular adenopathy.     Left upper body: No supraclavicular adenopathy.  Skin:    General: Skin is warm.     Findings: No erythema.  Neurological:     Mental Status: He is alert and oriented to person, place, and time.     Cranial Nerves: No cranial nerve deficit.     Sensory: No sensory deficit.     Coordination: Coordination normal.     Gait: Gait normal.     Deep Tendon Reflexes:     Reflex Scores:      Bicep reflexes are 2+ on the right side and 2+ on the left side.      Patellar reflexes are 2+ on the right side and 2+ on the left side. Psychiatric:        Mood and Affect: Affect normal. Mood is anxious.    ASSESSMENT AND PLAN:  Mr.Alani was seen today for annual exam.  Diagnoses and all orders for this visit: Orders Placed This Encounter  Procedures  . DG Chest 2 View  . Basic metabolic panel  . Hepatitis C antibody  .  Lipid panel  . CBC with Differential/Platelet  . Ambulatory referral to Pulmonology   Lab Results  Component Value Date   CREATININE 0.96 06/20/2020   BUN 10 06/20/2020   NA 139 06/20/2020   K 4.1 06/20/2020   CL 101 06/20/2020   CO2 27 06/20/2020   Lab Results  Component Value Date   WBC 6.9 06/20/2020   HGB 15.7 06/20/2020   HCT 46.8 06/20/2020   MCV 92.7 06/20/2020   PLT 349 06/20/2020   Lab Results  Component Value Date   CHOL 249 (H) 06/20/2020   HDL 40 06/20/2020   LDLCALC 164 (H) 06/20/2020   LDLDIRECT 159.0 06/19/2019   TRIG 273 (H) 06/20/2020   CHOLHDL 6.2 (H) 06/20/2020    Routine general medical examination at a health care facility We discussed the importance of regular physical activity and healthy diet for prevention of chronic illness and/or complications. Preventive guidelines reviewed. Vaccination up to date.  Next CPE in a year.  Hyperlipidemia, unspecified hyperlipidemia type Continue non pharmacologic treatment. Further recommendations will be given according to FLP results.  Encounter for HCV screening test for low risk patient -     Hepatitis C antibody  Screening for endocrine, metabolic and immunity disorder -     Basic metabolic panel  Insomnia, unspecified type Doxepin did not help. For now recommend good sleep hygiene.  Allergic rhinitis, unspecified seasonality, unspecified trigger This problem could be causing "breathing problems" at night. Recommend using Flonase nasal spray daily as needed. Nasal saline irrigations may help.  Breathing difficult ? OSA, allergic rhinitis. He is very concerned about the possibility of serious process, tried to reassure. He would like pulmonology evaluation.   Return in 1 year (on 06/20/2021) for cpe, before if needed..    Alegra Rost G. Swaziland, MD  [facility]. [place] [facility].  A few things to remember from  today's visit:  I think breathing difficulty you are reporting is  related to allergies. Because you are very concerned, pulmonology referral placed. I do not think it is a serious process. Stop Benadryl. Resume Flonase nasal spray. Nasal irrigations with saline as needed.  If you need refills please call your pharmacy. Do not use My Chart to request refills or for acute issues that need immediate attention.    Please be sure medication list is accurate. If a new problem present, please set up appointment sooner than planned today.   At least 150 minutes of moderate exercise per week, daily brisk walking for 15-30 min is a good exercise option. Healthy diet low in saturated (animal) fats and sweets and consisting of fresh fruits and vegetables, lean meats such as fish and white chicken and whole grains.  - Vaccines:  Tdap vaccine every 10 years.  Shingles vaccine recommended at age 62, could be given after 37 years of age but not sure about insurance coverage.  Pneumonia vaccines: Pneumovax at 71  -Screening recommendations for low/normal risk males:  Screening for diabetes at age 39 and every 3 years. Earlier screening if cardiovascular risk factors.   Lipid screening at 35 and every 3 years. Screening starts in younger males with cardiovascular risk factors. N/A  Colon cancer screening is now at age 52 but your insurance may not cover until age 23 .screening is recommended age 58.  Prostate cancer screening: some controversy, starts usually at 50: Rectal exam and PSA.  Aortic Abdominal Aneurism once between 22 and 50 years old if ever smoker.  Also recommended:  1. Dental visit- Brush and floss your teeth twice daily; visit your dentist twice a year. 2. Eye doctor- Get an eye exam at least every 2 years. 3. Helmet use- Always wear a helmet when riding a bicycle, motorcycle, rollerblading or skateboarding. 4. Safe sex- If you may be exposed to sexually transmitted infections, use a condom. 5. Seat belts- Seat belts can save your live;  always wear one. 6. Smoke/Carbon Monoxide detectors- These detectors need to be installed on the appropriate level of your home. Replace batteries at least once a year. 7. Skin cancer- When out in the sun please cover up and use sunscreen 15 SPF or higher. 8. Violence- If anyone is threatening or hurting you, please tell your healthcare provider.  9. Drink alcohol in moderation- Limit alcohol intake to one drink or less per day. Never drink and drive.

## 2020-06-20 ENCOUNTER — Encounter: Payer: Self-pay | Admitting: Family Medicine

## 2020-06-20 ENCOUNTER — Ambulatory Visit (INDEPENDENT_AMBULATORY_CARE_PROVIDER_SITE_OTHER): Payer: No Typology Code available for payment source | Admitting: Family Medicine

## 2020-06-20 ENCOUNTER — Other Ambulatory Visit: Payer: Self-pay

## 2020-06-20 VITALS — BP 110/70 | HR 79 | Resp 12 | Ht 64.0 in | Wt 112.0 lb

## 2020-06-20 DIAGNOSIS — Z130 Encounter for screening for diseases of the blood and blood-forming organs and certain disorders involving the immune mechanism: Secondary | ICD-10-CM

## 2020-06-20 DIAGNOSIS — Z1329 Encounter for screening for other suspected endocrine disorder: Secondary | ICD-10-CM

## 2020-06-20 DIAGNOSIS — G4700 Insomnia, unspecified: Secondary | ICD-10-CM

## 2020-06-20 DIAGNOSIS — Z0000 Encounter for general adult medical examination without abnormal findings: Secondary | ICD-10-CM

## 2020-06-20 DIAGNOSIS — Z1159 Encounter for screening for other viral diseases: Secondary | ICD-10-CM | POA: Diagnosis not present

## 2020-06-20 DIAGNOSIS — E785 Hyperlipidemia, unspecified: Secondary | ICD-10-CM

## 2020-06-20 DIAGNOSIS — J309 Allergic rhinitis, unspecified: Secondary | ICD-10-CM

## 2020-06-20 DIAGNOSIS — Z13228 Encounter for screening for other metabolic disorders: Secondary | ICD-10-CM

## 2020-06-20 DIAGNOSIS — R0689 Other abnormalities of breathing: Secondary | ICD-10-CM

## 2020-06-20 NOTE — Patient Instructions (Addendum)
A few things to remember from today's visit:   Hyperlipidemia, unspecified hyperlipidemia type - Plan: Lipid panel  Encounter for HCV screening test for low risk patient - Plan: Hepatitis C antibody  Screening for endocrine, metabolic and immunity disorder - Plan: Basic metabolic panel  Routine general medical examination at a health care facility  Insomnia, unspecified type  Allergic rhinitis, unspecified seasonality, unspecified trigger  Breathing difficult - Plan: Ambulatory referral to Pulmonology, DG Chest 2 View   I think breathing difficulty you are reporting is related to allergies. Because you are very concerned, pulmonology referral placed. I do not think it is a serious process. Stop Benadryl. Resume Flonase nasal spray. Nasal irrigations with saline as needed.  If you need refills please call your pharmacy. Do not use My Chart to request refills or for acute issues that need immediate attention.    Please be sure medication list is accurate. If a new problem present, please set up appointment sooner than planned today.   At least 150 minutes of moderate exercise per week, daily brisk walking for 15-30 min is a good exercise option. Healthy diet low in saturated (animal) fats and sweets and consisting of fresh fruits and vegetables, lean meats such as fish and white chicken and whole grains.  - Vaccines:  Tdap vaccine every 10 years.  Shingles vaccine recommended at age 59, could be given after 37 years of age but not sure about insurance coverage.  Pneumonia vaccines: Pneumovax at 7  -Screening recommendations for low/normal risk males:  Screening for diabetes at age 65 and every 3 years. Earlier screening if cardiovascular risk factors.   Lipid screening at 35 and every 3 years. Screening starts in younger males with cardiovascular risk factors. N/A  Colon cancer screening is now at age 17 but your insurance may not cover until age 13 .screening is  recommended age 31.  Prostate cancer screening: some controversy, starts usually at 50: Rectal exam and PSA.  Aortic Abdominal Aneurism once between 42 and 54 years old if ever smoker.  Also recommended:  1. Dental visit- Brush and floss your teeth twice daily; visit your dentist twice a year. 2. Eye doctor- Get an eye exam at least every 2 years. 3. Helmet use- Always wear a helmet when riding a bicycle, motorcycle, rollerblading or skateboarding. 4. Safe sex- If you may be exposed to sexually transmitted infections, use a condom. 5. Seat belts- Seat belts can save your live; always wear one. 6. Smoke/Carbon Monoxide detectors- These detectors need to be installed on the appropriate level of your home. Replace batteries at least once a year. 7. Skin cancer- When out in the sun please cover up and use sunscreen 15 SPF or higher. 8. Violence- If anyone is threatening or hurting you, please tell your healthcare provider.  9. Drink alcohol in moderation- Limit alcohol intake to one drink or less per day. Never drink and drive.

## 2020-06-21 LAB — CBC WITH DIFFERENTIAL/PLATELET
Absolute Monocytes: 311 cells/uL (ref 200–950)
Basophils Absolute: 48 cells/uL (ref 0–200)
Basophils Relative: 0.7 %
Eosinophils Absolute: 62 cells/uL (ref 15–500)
Eosinophils Relative: 0.9 %
HCT: 46.8 % (ref 38.5–50.0)
Hemoglobin: 15.7 g/dL (ref 13.2–17.1)
Lymphs Abs: 1870 cells/uL (ref 850–3900)
MCH: 31.1 pg (ref 27.0–33.0)
MCHC: 33.5 g/dL (ref 32.0–36.0)
MCV: 92.7 fL (ref 80.0–100.0)
MPV: 9.8 fL (ref 7.5–12.5)
Monocytes Relative: 4.5 %
Neutro Abs: 4609 cells/uL (ref 1500–7800)
Neutrophils Relative %: 66.8 %
Platelets: 349 10*3/uL (ref 140–400)
RBC: 5.05 10*6/uL (ref 4.20–5.80)
RDW: 12.1 % (ref 11.0–15.0)
Total Lymphocyte: 27.1 %
WBC: 6.9 10*3/uL (ref 3.8–10.8)

## 2020-06-21 LAB — LIPID PANEL
Cholesterol: 249 mg/dL — ABNORMAL HIGH (ref <200)
HDL: 40 mg/dL (ref >=40)
LDL Cholesterol (Calc): 164 mg/dL (calc) — ABNORMAL HIGH
Non-HDL Cholesterol (Calc): 209 mg/dL (calc) — ABNORMAL HIGH (ref <130)
Total CHOL/HDL Ratio: 6.2 (calc) — ABNORMAL HIGH (ref <5.0)
Triglycerides: 273 mg/dL — ABNORMAL HIGH (ref <150)

## 2020-06-21 LAB — BASIC METABOLIC PANEL
BUN: 10 mg/dL (ref 7–25)
CO2: 27 mmol/L (ref 20–32)
Calcium: 9.5 mg/dL (ref 8.6–10.3)
Chloride: 101 mmol/L (ref 98–110)
Creat: 0.96 mg/dL (ref 0.60–1.35)
Glucose, Bld: 99 mg/dL (ref 65–99)
Potassium: 4.1 mmol/L (ref 3.5–5.3)
Sodium: 139 mmol/L (ref 135–146)

## 2020-06-21 LAB — HEPATITIS C ANTIBODY
Hepatitis C Ab: NONREACTIVE
SIGNAL TO CUT-OFF: 0 (ref <1.00)

## 2020-07-12 ENCOUNTER — Encounter: Payer: Self-pay | Admitting: Family Medicine

## 2020-08-09 ENCOUNTER — Encounter: Payer: Self-pay | Admitting: Critical Care Medicine

## 2020-08-09 ENCOUNTER — Other Ambulatory Visit: Payer: Self-pay

## 2020-08-09 ENCOUNTER — Ambulatory Visit (INDEPENDENT_AMBULATORY_CARE_PROVIDER_SITE_OTHER): Payer: No Typology Code available for payment source | Admitting: Critical Care Medicine

## 2020-08-09 VITALS — BP 110/78 | HR 88 | Temp 98.5°F | Ht 64.0 in | Wt 112.8 lb

## 2020-08-09 DIAGNOSIS — G4700 Insomnia, unspecified: Secondary | ICD-10-CM | POA: Diagnosis not present

## 2020-08-09 DIAGNOSIS — R0602 Shortness of breath: Secondary | ICD-10-CM

## 2020-08-09 DIAGNOSIS — R0982 Postnasal drip: Secondary | ICD-10-CM

## 2020-08-09 MED ORDER — AZELASTINE HCL 0.1 % NA SOLN
2.0000 | Freq: Two times a day (BID) | NASAL | 12 refills | Status: DC
Start: 2020-08-09 — End: 2021-08-18

## 2020-08-09 NOTE — Patient Instructions (Addendum)
Thank you for visiting Dr. Chestine Spore at [facility]. We recommend the following: Orders Placed This Encounter  Procedures  . Split night study   Orders Placed This Encounter  Procedures  . Split night study    Standing Status:   Future    Standing Expiration Date:   08/09/2021    Order Specific Question:   Where should this test be performed:    Answer:   [facility] - [facility]    Meds ordered this encounter  Medications  . azelastine (ASTELIN) 0.1 % nasal spray    Sig: Place 2 sprays into both nostrils 2 (two) times daily. Use in each nostril as directed    Dispense:  30 mL    Refill:  12    Return in about 2 months (around 10/09/2020). after PSG.    Please do your part to reduce the spread of COVID-19.

## 2020-08-09 NOTE — Progress Notes (Signed)
Synopsis: Referred in August 2021 for SOB by [place], Betty G, MD.  Subjective:   PATIENT ID: Joel Thompson GENDER: male DOB: 03-09-1981, MRN: [id number]  Chief Complaint  Patient presents with  . Consult    Patient states that he has to clear his thraot all the time and that he has thick phelgm that sticks to the back of his throat. Does not wake up during the night due to feeling like he cant breath. Does have trouble sleeping due to Insomnia.     Mr. Que is a 39 year old gentleman who presents for evaluation of thick phlegm in the back of his throat, trouble breathing lying down, and being told by his smart watch that he may have mild breathing difficulties.  He has nasal congestion with postnasal drip, which is recently been treated with Flonase.  He has thick drainage to the back of his throat mostly in the afternoon and evening, but not the morning.  The phlegm is clear, sometimes discolored green.  No fever, chills, shortness of breath, wheezing.  He occasionally has a dry cough.  He exercises regularly without issues.  He has nasal congestion when laying on his right side which improves when lying on his left side.  No issues with thick drainage from his nose.  He does not know of previous nasal trauma.  His [employer] watch has an associated app that gave him a sleep score in the 90s but told him he may have mild breathing issues.  It did not elaborate further.  He sleeps alone, but has never been told that he snores or has had witnessed apneas.  He does not wake up with headaches in the morning.  He has always been a light sleeper and can arouse easily to lights or sound.  He has had insomnia that makes it difficult to stay asleep for the last year.  He is unsure what triggered this over the last year.  He has never smoked or vaped.  He is up-to-date on both Covid vaccines.       Past Medical History:  Diagnosis Date  . Allergy   . Cardiac arrhythmia, unspecified   .  Hyperlipidemia      Family History  Problem Relation Age of Onset  . Diabetes Mother   . Hypertension Father      History reviewed. No pertinent surgical history.  Social History   Socioeconomic History  . Marital status: Single    Spouse name: Not on file  . Number of children: Not on file  . Years of education: Not on file  . Highest education level: Not on file  Occupational History  . Not on file  Tobacco Use  . Smoking status: Never Smoker  . Smokeless tobacco: Never Used  Vaping Use  . Vaping Use: Never used  Substance and Sexual Activity  . Alcohol use: Not on file  . Drug use: Never  . Sexual activity: Not on file  Other Topics Concern  . Not on file  Social History Narrative  . Not on file   Social Determinants of Health   Financial Resource Strain:   . Difficulty of Paying Living Expenses: Not on file  Food Insecurity:   . Worried About Programme researcher, broadcasting/film/video in the Last Year: Not on file  . Ran Out of Food in the Last Year: Not on file  Transportation Needs:   . Lack of Transportation (Medical): Not on file  . Lack of Transportation (Non-Medical): Not  on file  Physical Activity:   . Days of Exercise per Week: Not on file  . Minutes of Exercise per Session: Not on file  Stress:   . Feeling of Stress : Not on file  Social Connections:   . Frequency of Communication with Friends and Family: Not on file  . Frequency of Social Gatherings with Friends and Family: Not on file  . Attends Religious Services: Not on file  . Active Member of Clubs or Organizations: Not on file  . Attends Banker Meetings: Not on file  . Marital Status: Not on file  Intimate Partner Violence:   . Fear of Current or Ex-Partner: Not on file  . Emotionally Abused: Not on file  . Physically Abused: Not on file  . Sexually Abused: Not on file     No Known Allergies   Immunization History  Administered Date(s) Administered  . Tdap 06/19/2019    Outpatient  Medications Prior to Visit  Medication Sig Dispense Refill  . diphenhydrAMINE (ALLERGY RELIEF) 25 MG tablet Take 25 mg by mouth every 6 (six) hours as needed.    . doxepin (SINEQUAN) 10 MG/ML solution Take 0.3-1 mLs (3-10 mg total) by mouth at bedtime. 30 mL 2  . fluticasone (FLONASE) 50 MCG/ACT nasal spray Place 2 sprays into both nostrils at bedtime as needed for allergies or rhinitis. 16 g 3   No facility-administered medications prior to visit.    Review of Systems  Constitutional: Negative for fever and weight loss.  HENT: Positive for congestion.   Respiratory: Positive for cough. Negative for shortness of breath.   Cardiovascular:       CP with stress- negative cardiac workup  Gastrointestinal: Negative for blood in stool, heartburn, nausea and vomiting.  Genitourinary: Negative for hematuria.  Musculoskeletal: Negative for joint pain.  Skin: Negative for rash.  Endo/Heme/Allergies: Positive for environmental allergies.     Objective:   Vitals:   08/09/20 1427  BP: 110/78  Pulse: 88  Temp: 98.5 F (36.9 C)  TempSrc: Temporal  SpO2: 95%  Weight: 112 lb 12.8 oz (51.2 kg)  Height: 5\' 4"  (1.626 m)   95% on  RA BMI Readings from Last 3 Encounters:  08/09/20 19.36 kg/m  06/20/20 19.22 kg/m  10/06/19 18.78 kg/m   Wt Readings from Last 3 Encounters:  08/09/20 112 lb 12.8 oz (51.2 kg)  06/20/20 112 lb (50.8 kg)  10/06/19 109 lb 6.4 oz (49.6 kg)    Physical Exam Vitals reviewed.  Constitutional:      General: He is not in acute distress.    Appearance: Normal appearance. He is not ill-appearing.  HENT:     Head: Normocephalic and atraumatic.     Nose:     Comments: Mild erythema, no drainage    Mouth/Throat:     Comments: Mallampati 4, unable to see oropharynx, but no palate erythema or plaques. Eyes:     General: No scleral icterus. Cardiovascular:     Rate and Rhythm: Normal rate and regular rhythm.     Heart sounds: No murmur heard.   Pulmonary:      Comments: Breathing comfortably on room air, no conversational dyspnea.  Clear to auscultation bilaterally. Abdominal:     General: There is no distension.     Palpations: Abdomen is soft.     Tenderness: There is no abdominal tenderness.  Musculoskeletal:        General: No swelling or deformity.     Cervical back: Neck  supple.  Lymphadenopathy:     Cervical: No cervical adenopathy.  Skin:    General: Skin is warm and dry.     Findings: No rash.  Neurological:     General: No focal deficit present.     Mental Status: He is alert.     Coordination: Coordination normal.  Psychiatric:        Mood and Affect: Mood normal.        Behavior: Behavior normal.      CBC    Component Value Date/Time   WBC 6.9 06/20/2020 0823   RBC 5.05 06/20/2020 0823   HGB 15.7 06/20/2020 0823   HCT 46.8 06/20/2020 0823   PLT 349 06/20/2020 0823   MCV 92.7 06/20/2020 0823   MCH 31.1 06/20/2020 0823   MCHC 33.5 06/20/2020 0823   RDW 12.1 06/20/2020 0823   LYMPHSABS 1,870 06/20/2020 0823   EOSABS 62 06/20/2020 0823   BASOSABS 48 06/20/2020 0823    CHEMISTRY No results for input(s): NA, K, CL, CO2, GLUCOSE, BUN, CREATININE, CALCIUM, MG, PHOS in the last 168 hours. CrCl cannot be calculated (Patient's most recent lab result is older than the maximum 21 days allowed.).   Chest Imaging- films reviewed: Chest x-ray 1 view 07/07/2020(report only available )  Normal  Pulmonary Functions Testing Results: No flowsheet data found.      Assessment & Plan:     ICD-10-CM   1. SOB (shortness of breath)  R06.02 Split night study  2. Insomnia, unspecified type  G47.00 Split night study  3. 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 -If he developed signs or symptoms of infection, I would be worried about the character of his  sputum, but in isolation this is unlikely to be associated with significant infection. -Would consider ENT evaluation if concern for significant sinus disease.  Insomnia; concern for breathing difficulties from his smart watch, but unsure what symptom this relates to him. -Split-night PSG.  If unable to be approved for insurance will pursue a home sleep study.  RTC in 1-2 months after sleep study.    Current Outpatient Medications:  .  diphenhydrAMINE (ALLERGY RELIEF) 25 MG tablet, Take 25 mg by mouth every 6 (six) hours as needed., Disp: , Rfl:  .  doxepin (SINEQUAN) 10 MG/ML solution, Take 0.3-1 mLs (3-10 mg total) by mouth at bedtime., Disp: 30 mL, Rfl: 2 .  fluticasone (FLONASE) 50 MCG/ACT nasal spray, Place 2 sprays into both nostrils at bedtime as needed for allergies or rhinitis., Disp: 16 g, Rfl: 3 .  azelastine (ASTELIN) 0.1 % nasal spray, Place 2 sprays into both nostrils 2 (two) times daily. Use in each nostril as directed, Disp: 30 mL, Rfl: 12     Steffanie Dunn, DO [facility] 08/09/2020 3:07 PM

## 2020-08-22 ENCOUNTER — Telehealth: Payer: Self-pay | Admitting: Family Medicine

## 2020-08-22 DIAGNOSIS — G4700 Insomnia, unspecified: Secondary | ICD-10-CM

## 2020-08-22 MED ORDER — DOXEPIN HCL 10 MG/ML PO CONC: 3.0000 mg | Freq: Every day | ORAL | 0 refills | Status: DC

## 2020-08-22 NOTE — Telephone Encounter (Signed)
Rx sent in as requested. 

## 2020-08-22 NOTE — Telephone Encounter (Signed)
   doxepin (SINEQUAN) 10 MG/ML solution   [id number] - [place], [place] - [address] Phone:  [phone]  Fax:  [phone]

## 2020-09-10 ENCOUNTER — Ambulatory Visit (HOSPITAL_BASED_OUTPATIENT_CLINIC_OR_DEPARTMENT_OTHER): Payer: No Typology Code available for payment source | Attending: Critical Care Medicine | Admitting: Internal Medicine

## 2020-09-10 ENCOUNTER — Other Ambulatory Visit: Payer: Self-pay

## 2020-09-10 VITALS — Ht 64.0 in | Wt 112.0 lb

## 2020-09-10 DIAGNOSIS — R0602 Shortness of breath: Secondary | ICD-10-CM

## 2020-09-10 DIAGNOSIS — G4700 Insomnia, unspecified: Secondary | ICD-10-CM | POA: Insufficient documentation

## 2020-09-10 DIAGNOSIS — Z79899 Other long term (current) drug therapy: Secondary | ICD-10-CM | POA: Diagnosis not present

## 2020-09-12 ENCOUNTER — Other Ambulatory Visit: Payer: Self-pay

## 2020-09-18 ENCOUNTER — Other Ambulatory Visit: Payer: Self-pay | Admitting: Family Medicine

## 2020-09-18 DIAGNOSIS — R0602 Shortness of breath: Secondary | ICD-10-CM

## 2020-09-18 DIAGNOSIS — G4700 Insomnia, unspecified: Secondary | ICD-10-CM

## 2020-09-18 NOTE — Procedures (Signed)
   Patient Name: Joel Thompson, Prajapati Date: 09/10/2020 Gender: Male D.O.B: September 09, 1981 Age (years): 38 Referring Provider: Karie Fetch DO Height (inches): 64 Interpreting Physician: Jetty Duhamel MD, ABSM Weight (lbs): 112 RPSGT: Rolene Arbour BMI: 19 MRN: [id number] Neck Size: 12.00  CLINICAL INFORMATION Sleep Study Type: NPSG Indication for sleep study: Insomnia Epworth Sleepiness Score: 1  SLEEP STUDY TECHNIQUE As per the AASM Manual for the Scoring of Sleep and Associated Events v2.3 (April 2016) with a hypopnea requiring 4% desaturations.  The channels recorded and monitored were frontal, central and occipital EEG, electrooculogram (EOG), submentalis EMG (chin), nasal and oral airflow, thoracic and abdominal wall motion, anterior tibialis EMG, snore microphone, electrocardiogram, and pulse oximetry.  MEDICATIONS Medications self-administered by patient taken the night of the study : DOXEPIN  SLEEP ARCHITECTURE The study was initiated at 10:06:55 PM and ended at 4:33:35 AM.  Sleep onset time was 65.3 minutes and the sleep efficiency was 23.1%%. The total sleep time was 89.5 minutes.  Stage REM latency was N/A minutes.  The patient spent 10.6%% of the night in stage N1 sleep, 89.4%% in stage N2 sleep, 0.0%% in stage N3 and 0% in REM.  Alpha intrusion was absent.  Supine sleep was 100.00%.  RESPIRATORY PARAMETERS The overall apnea/hypopnea index (AHI) was 0.7 per hour. There were 0 total apneas, including 0 obstructive, 0 central and 0 mixed apneas. There were 1 hypopneas and 0 RERAs.  The AHI during Stage REM sleep was N/A per hour.  AHI while supine was 0.7 per hour.  The mean oxygen saturation was 94.7%. The minimum SpO2 during sleep was 91.0%.  snoring was noted during this study.  CARDIAC DATA The 2 lead EKG demonstrated sinus rhythm. The mean heart rate was 66.7 beats per minute. Other EKG findings include: None.  LEG MOVEMENT DATA The total PLMS were  0 with a resulting PLMS index of 0.0. Associated arousal with leg movement index was 0.0 .  IMPRESSIONS - No significant obstructive sleep apnea occurred during this study (AHI = 0.7/h). - No significant central sleep apnea occurred during this study (CAI = 0.0/h). - The patient had minimal or no oxygen desaturation during the study (Min O2 = 91.0%) - No snoring was audible during this study. - No cardiac abnormalities were noted during this study. - Clinically significant periodic limb movements did not occur during sleep.  - Patient had significant difficulty initiating and maintaining sleep,despite doxepin. Total sleep time only 89.5 minutes.  DIAGNOSIS - Insomnia  RECOMMENDATIONS - Manage for insomnia with attention to possible organic or psychosocial factors.  - Sleep hygiene should be reviewed to assess factors that may improve sleep quality. - Weight management and regular exercise should be initiated or continued if appropriate.  [Electronically signed] 09/18/2020 11:27 AM  Jetty Duhamel MD, ABSM Diplomate, American Board of Sleep Medicine   NPI: [id number]                         Jetty Duhamel Diplomate, American Board of Sleep Medicine  ELECTRONICALLY SIGNED ON:  09/18/2020, 11:23 AM [facility] PH: [phone]   FX: [phone] ACCREDITED BY THE AMERICAN ACADEMY OF SLEEP MEDICINE

## 2020-09-19 NOTE — Progress Notes (Signed)
Please let Joel Thompson know that he does not have sleep apnea and his sleep study confirms that he has insomnia. This can be addressed with your primary care doctor or if you would like, a sleep medicine doctor. I am not a sleep medicine physician, so if he would prefer to see one, please have him set up with one of the Sleep Medicine physicians in our office or with neurology. Thanks! LPC

## 2020-10-17 ENCOUNTER — Other Ambulatory Visit: Payer: Self-pay | Admitting: Family Medicine

## 2020-10-17 DIAGNOSIS — G4700 Insomnia, unspecified: Secondary | ICD-10-CM

## 2020-10-28 ENCOUNTER — Encounter: Payer: Self-pay | Admitting: Family Medicine

## 2020-10-28 ENCOUNTER — Other Ambulatory Visit: Payer: Self-pay

## 2020-10-28 ENCOUNTER — Ambulatory Visit (INDEPENDENT_AMBULATORY_CARE_PROVIDER_SITE_OTHER): Payer: No Typology Code available for payment source | Admitting: Family Medicine

## 2020-10-28 VITALS — BP 110/80 | HR 78 | Temp 98.8°F | Resp 12 | Ht 64.0 in | Wt 108.0 lb

## 2020-10-28 DIAGNOSIS — R002 Palpitations: Secondary | ICD-10-CM

## 2020-10-28 DIAGNOSIS — R0789 Other chest pain: Secondary | ICD-10-CM

## 2020-10-28 DIAGNOSIS — M7918 Myalgia, other site: Secondary | ICD-10-CM | POA: Diagnosis not present

## 2020-10-28 DIAGNOSIS — G4700 Insomnia, unspecified: Secondary | ICD-10-CM

## 2020-10-28 DIAGNOSIS — E785 Hyperlipidemia, unspecified: Secondary | ICD-10-CM

## 2020-10-28 NOTE — Progress Notes (Signed)
Chief Complaint  Patient presents with  . Palpitations  . Chest Pain    HPI: [name] is a 39 y.o. male, who is here today with above complaints. He was recently evaluated in the ER for CP and LUE numbness, 10/24/20. Also evaluated in the ER for CP on 07/07/20.  Concerned about heart disease. His sister just had a MI in her early 6's.  Intermittent episodes of left-sided pressure like pain. It happens almost daily, usually starts late morning to afternoon. It is not exacerbated by exertion but sometimes starts after certain activities like brisk walking. Pain is not radiated, sometimes associated with palpitations. "Sometimes" he has some difficulty breathing. He can check pulse Ox with his watch and it is normal.  He has not identifies exacerbating or alleviating factors. Problem can last hours. Pain does not interfere with sleep.   LUE numbness resolved.  Negative for weakness. He does not have neck pain,cough,or wheezing.  CXR 10/24/20: Negative for acute cardiopulmonary dosease.  Troponin 5 (<22).  Ref Range & Units 6 d ago  WBC 5.1 - 10.8 thou/mcL 8.1      RBC 4.05 - 5.64 million/mcL 5.01      HGB 13.5 - 17.5 gm/dL 15.0      HCT 40.5 - 52.5 % 46.2      MCV 83 - 97 fL 92      MCH 28.0 - 33.0 pg 29.9      MCHC 32.0 - 36.0 gm/dL 32.5      Plt Ct 150 - 400 thou/mcL 335      RDW SD 36.0 - 47.0 fL 40.0      MPV 8.9 - 11.0 fL 9.7      NRBC% 0 /100WBC 0.0      NRBC 0 thou/mcL 0.000      NEUTROPHIL % 50.0 - 70.0 % 67.8      LYMPHOCYTE % 25.0 - 40.0 % 27.0      MONOCYTE % 4.0 - 12.0 % 4.1      Eosinophil % 1.0 - 6.0 % 0.1Low      BASOPHIL % 0.0 - 2.0 % 0.6      IG% 0.001 - 0.429 % 0.400      ABSOLUTE NEUTROPHIL COUNT 1.50 - 7.50 thou/mcL 5.48      ABSOLUTE LYMPHOCYTE COUNT 1.0 - 4.5 thou/mcL 2.2      MONO ABSOLUTE 0.1 - 0.8 thou/mcL 0.3      EOS ABSOLUTE 0.0 - 0.5 thou/mcL 0.0      BASO ABSOLUTE 0.0 - 0.2 thou/mcL 0.1      IG  ABSOLUTE 0.001 - 0.031 thou/mcL  0.030    Mg 2.2.  He does not feel like he is stress or anxious.  He was evaluated by Dr Gwenlyn Found 10/06/19. Further work up was not deemed necessary.  Following with pulmonologist because SOB.  Negative for epigastric abdominal pain,N/V,changes in bowel habits or melena. Occasional LUQ pain,mild, not radiated.  Ref Range & Units 6 d ago Comments  Na 136 - 146 mmol/L 138        Potassium 3.7 - 5.4 mmol/L 3.8        Cl 97 - 108 mmol/L 99        CO2 20 - 32 mmol/L 30        AGAP 7 - 16 mmol/L 9        Glucose 65 - 99 mg/dL 118High        BUN  6 - 20 mg/dL 10        Creatinine 0.76 - 1.27 mg/dL 0.80        Ca 8.7 - 10.2 mg/dL 9.7        ALK PHOS 25 - 150 U/L 65        T Bili 0.00 - 1.20 mg/dL 0.52        Total Protein 6.0 - 8.5 gm/dL 7.3        Alb 3.5 - 5.5 gm/dL 4.9        GLOBULIN 1.5 - 4.5 gm/dL 2.4        ALBUMIN/GLOBULIN RATIO 1.1 - 2.5  2.0        BUN/CREAT RATIO 11.0 - 26.0  12.5        ALT 0 - 55 U/L 14        AST 0 - 40 U/L 13        GFR AFRICAN AMERICAN mL/min/1.26m 133      He has also noted pain on "different areas" of body: LE's,fingers. No joint edema or erythema.  Insomnia:: Discontinued Doxepin, it was not longer helping.  He is not longer having difficulty falling asleep but wakes up once through the night, able to go back to sleep.  HLD: He is on non pharmacologic treatment. Lab Results  Component Value Date   CHOL 249 (H) 06/20/2020   HDL 40 06/20/2020   LDLCALC 164 (H) 06/20/2020   LDLDIRECT 159.0 06/19/2019   TRIG 273 (H) 06/20/2020   CHOLHDL 6.2 (H) 06/20/2020   He has been eating healthier for the past 2-3 months.  Review of Systems  Constitutional: Negative for activity change, appetite change and fever.  HENT: Negative for nosebleeds, sore throat and trouble swallowing.   Eyes: Negative for redness and visual disturbance.  Endocrine: Negative for cold intolerance, heat intolerance,  polydipsia, polyphagia and polyuria.  Genitourinary: Negative for decreased urine volume and hematuria.  Neurological: Negative for syncope, weakness and headaches.  Hematological: Negative for adenopathy. Does not bruise/bleed easily.  Psychiatric/Behavioral: Positive for sleep disturbance. Negative for confusion.  Rest see pertinent positives and negatives per HPI.  Current Outpatient Medications on File Prior to Visit  Medication Sig Dispense Refill  . azelastine (ASTELIN) 0.1 % nasal spray Place 2 sprays into both nostrils 2 (two) times daily. Use in each nostril as directed 30 mL 12  . diphenhydrAMINE (ALLERGY RELIEF) 25 MG tablet Take 25 mg by mouth every 6 (six) hours as needed.    . doxepin (SINEQUAN) 10 MG/ML solution TAKE 0.3-1 MLS (3-10 MG TOTAL) BY MOUTH AT BEDTIME 30 mL 6  . fluticasone (FLONASE) 50 MCG/ACT nasal spray Place 2 sprays into both nostrils at bedtime as needed for allergies or rhinitis. 16 g 3   No current facility-administered medications on file prior to visit.   Past Medical History:  Diagnosis Date  . Allergy   . Cardiac arrhythmia, unspecified   . Hyperlipidemia    No Known Allergies  Social History   Socioeconomic History  . Marital status: Single    Spouse name: Not on file  . Number of children: Not on file  . Years of education: Not on file  . Highest education level: Not on file  Occupational History  . Not on file  Tobacco Use  . Smoking status: Never Smoker  . Smokeless tobacco: Never Used  Vaping Use  . Vaping Use: Never used  Substance and Sexual Activity  . Alcohol use: Not on file  .  Drug use: Never  . Sexual activity: Not on file  Other Topics Concern  . Not on file  Social History Narrative  . Not on file   Social Determinants of Health   Financial Resource Strain:   . Difficulty of Paying Living Expenses: Not on file  Food Insecurity:   . Worried About Charity fundraiser in the Last Year: Not on file  . Ran Out of  Food in the Last Year: Not on file  Transportation Needs:   . Lack of Transportation (Medical): Not on file  . Lack of Transportation (Non-Medical): Not on file  Physical Activity:   . Days of Exercise per Week: Not on file  . Minutes of Exercise per Session: Not on file  Stress:   . Feeling of Stress : Not on file  Social Connections:   . Frequency of Communication with Friends and Family: Not on file  . Frequency of Social Gatherings with Friends and Family: Not on file  . Attends Religious Services: Not on file  . Active Member of Clubs or Organizations: Not on file  . Attends Archivist Meetings: Not on file  . Marital Status: Not on file    Vitals:   10/28/20 0824  BP: 110/80  Pulse: 78  Resp: 12  Temp: 98.8 F (37.1 C)  SpO2: 97%   Wt Readings from Last 3 Encounters:  10/28/20 108 lb (49 kg)  09/10/20 112 lb (50.8 kg)  08/09/20 112 lb 12.8 oz (51.2 kg)   Body mass index is 18.54 kg/m.  Physical Exam Vitals and nursing note reviewed.  Constitutional:      General: He is not in acute distress.    Appearance: He is well-developed and normal weight.  HENT:     Head: Normocephalic and atraumatic.     Mouth/Throat:     Mouth: Mucous membranes are moist.     Pharynx: Oropharynx is clear.  Eyes:     Conjunctiva/sclera: Conjunctivae normal.     Pupils: Pupils are equal, round, and reactive to light.  Cardiovascular:     Rate and Rhythm: Normal rate and regular rhythm.     Pulses:          Dorsalis pedis pulses are 2+ on the right side and 2+ on the left side.     Heart sounds: No murmur heard.   Pulmonary:     Effort: Pulmonary effort is normal. No respiratory distress.     Breath sounds: Normal breath sounds.  Chest:     Chest wall: No tenderness.     Comments: No tender trigger points. Abdominal:     Palpations: Abdomen is soft. There is no hepatomegaly or mass.     Tenderness: There is no abdominal tenderness.  Musculoskeletal:     Cervical  back: No tenderness or bony tenderness.     Thoracic back: No tenderness or bony tenderness. Scoliosis present.     Lumbar back: No tenderness or bony tenderness.  Lymphadenopathy:     Cervical: No cervical adenopathy.  Skin:    General: Skin is warm.     Findings: No erythema or rash.  Neurological:     Mental Status: He is alert and oriented to person, place, and time.     Cranial Nerves: No cranial nerve deficit.     Gait: Gait normal.  Psychiatric:     Comments: Well groomed, good eye contact.    ASSESSMENT AND PLAN:  Mr. Diane was seen today for  palpitations and chest pain.  Diagnoses and all orders for this visit:  Chest pressure Possible etiologies discussed. Explained that the probability of this being caused by cardiac disease is low, never zero. Instructed about warning signs. Follow with cardiologist.  Palpitations Improved Hx and examination today do not suggest a serious process. He will arrange appt with cardio as recommended to do so if symptoms reoccured. BB can be tried if problem continues being recurrent.  Hyperlipidemia, unspecified hyperlipidemia type We discussed FLP results. Continue working on low fat diet, We discussed a few benefits of statins in regard to CVD protection.  Musculoskeletal pain On different area of body, no trigger points found today. Monitor for new symptoms  Insomnia, unspecified type Improved. Continue good sleep hygiene.  Spent 40 minutes.  During this time history was obtained and documented, examination was performed, prior labs/imaging reviewed, and assessment/plan discussed.  Return in about 4 months (around 02/25/2021) for HLD.   Dechelle Attaway G. Martin[place], MD  [facility]. [place] office.  A few things to remember from today's visit:   Palpitations  Chest pressure  Hyperlipidemia, unspecified hyperlipidemia type  Musculoskeletal pain  I do not think chest pressure is caused by a heart  condition,the probability is low but never zero.Please arrange appointment with Dr Gwenlyn Found as instructed. Continue low fat diet and will check cholesterol next cpe.  If you need refills please call your pharmacy. Do not use My Chart to request refills or for acute issues that need immediate attention.    Please be sure medication list is accurate. If a new problem present, please set up appointment sooner than planned today.

## 2020-10-28 NOTE — Patient Instructions (Addendum)
A few things to remember from today's visit:   Palpitations  Chest pressure  Hyperlipidemia, unspecified hyperlipidemia type  Musculoskeletal pain  I do not think chest pressure is caused by a heart condition,the probability is low but never zero.Please arrange appointment with Dr Allyson Sabal as instructed. Continue low fat diet and will check cholesterol next cpe.  If you need refills please call your pharmacy. Do not use My Chart to request refills or for acute issues that need immediate attention.    Please be sure medication list is accurate. If a new problem present, please set up appointment sooner than planned today.

## 2020-10-31 ENCOUNTER — Ambulatory Visit: Payer: No Typology Code available for payment source | Admitting: Family Medicine

## 2020-11-24 ENCOUNTER — Telehealth: Payer: Self-pay | Admitting: Cardiovascular Disease

## 2020-11-24 NOTE — Telephone Encounter (Signed)
Spoke with Joel Thompson on the phone.  Joel Thompson states that he has been seen in [facility] ED and at his PCP for this chest pain.  Joel Thompson states that this is still happening on and off for about a month. Joel Thompson states that the pain/pressure comes on with moderate activity, but does not come on with low activity.  Joel Thompson states that we he feels the pressure/pain that he checks his heart rate on his apple watch and that his heartrate is usually in the 90-110bpm range.  Joel Thompson states that the ED physician and his PCP have told him that this is likely not cardiac related but still suggested that he return to the cardiology office, that is why he calls today. Appointment set up with Corine Shelter, PA for 12/06/20. Next available appointment for Dr. Allyson Sabal is mid February. Joel Thompson verbalizes understanding and agrees to appointment date and time. Instructed Joel Thompson that if the pain/pressure changes, gets worst or he just feels like he needs to be seen ASAP to go back to ED for evaluation.

## 2020-11-24 NOTE — Telephone Encounter (Signed)
Patient c/o Palpitations:  High priority if patient c/o lightheadedness, shortness of breath, or chest pain  1) How long have you had palpitations/irregular HR/ Afib? Are you having the symptoms now? A month patient is not having them right this min 2) Are you currently experiencing lightheadedness, SOB or CP? Some chest pain or pressure  3) Do you have a history of afib (atrial fibrillation) or irregular heart rhythm? No  4) Have you checked your BP or HR? (document readings if available): HR 72 now  5) Are you experiencing any other symptoms? No

## 2020-12-06 ENCOUNTER — Encounter: Payer: Self-pay | Admitting: Cardiology

## 2020-12-06 ENCOUNTER — Other Ambulatory Visit: Payer: Self-pay

## 2020-12-06 ENCOUNTER — Ambulatory Visit (INDEPENDENT_AMBULATORY_CARE_PROVIDER_SITE_OTHER): Payer: No Typology Code available for payment source | Admitting: Cardiology

## 2020-12-06 VITALS — BP 110/81 | HR 91 | Ht 64.0 in | Wt 105.8 lb

## 2020-12-06 DIAGNOSIS — R0789 Other chest pain: Secondary | ICD-10-CM | POA: Diagnosis not present

## 2020-12-06 DIAGNOSIS — R002 Palpitations: Secondary | ICD-10-CM | POA: Diagnosis not present

## 2020-12-06 DIAGNOSIS — E782 Mixed hyperlipidemia: Secondary | ICD-10-CM | POA: Insufficient documentation

## 2020-12-06 DIAGNOSIS — Z8249 Family history of ischemic heart disease and other diseases of the circulatory system: Secondary | ICD-10-CM

## 2020-12-06 DIAGNOSIS — E785 Hyperlipidemia, unspecified: Secondary | ICD-10-CM | POA: Diagnosis not present

## 2020-12-06 DIAGNOSIS — R0602 Shortness of breath: Secondary | ICD-10-CM

## 2020-12-06 NOTE — Patient Instructions (Signed)
Medication Instructions:  Continue current medications  *If you need a refill on your cardiac medications before your next appointment, please call your pharmacy*   Lab Work: None Ordered   Testing/Procedures: Your physician has requested that you have an exercise tolerance test. For further information please visit [email or web address] Please also follow instruction sheet, as given.  Your physician has requested that you have an echocardiogram. Echocardiography is a painless test that uses sound waves to create images of your heart. It provides your doctor with information about the size and shape of your heart and how well your heart's chambers and valves are working. This procedure takes approximately one hour. There are no restrictions for this procedure.   Follow-Up: At [facility], you and your health needs are our priority.  As part of our continuing mission to provide you with exceptional heart care, we have created designated Provider Care Teams.  These Care Teams include your primary Cardiologist (physician) and Advanced Practice Providers (APPs -  Physician Assistants and Nurse Practitioners) who all work together to provide you with the care you need, when you need it.  We recommend signing up for the patient portal called "MyChart".  Sign up information is provided on this After Visit Summary.  MyChart is used to connect with patients for Virtual Visits (Telemedicine).  Patients are able to view lab/test results, encounter notes, upcoming appointments, etc.  Non-urgent messages can be sent to your provider as well.   To learn more about what you can do with MyChart, go to [email or web address].    Your next appointment:   6-8 week(s)  The format for your next appointment:   In Person  Provider:   You may see Nanetta Batty, MD or one of the following Advanced Practice Providers on your designated Care Team:    Corine Shelter, PA-C  [place], [place]  Edd Fabian, [place]

## 2020-12-06 NOTE — Assessment & Plan Note (Signed)
Sister had an MI in her 17's

## 2020-12-06 NOTE — Assessment & Plan Note (Signed)
Chest "pressure" and palpitations associated with exertion

## 2020-12-06 NOTE — Assessment & Plan Note (Signed)
LDL 163 July 2021

## 2020-12-06 NOTE — Progress Notes (Signed)
Cardiology Office Note:    Date:  12/06/2020   ID:  Joel Thompson, DOB [birth year], MRN [id number]  PCP:  [place], Betty G, MD  Cardiologist:  Dr Allyson Sabal Electrophysiologist:  None   Referring MD: [place], Betty G, MD   CC: chest pain and palpitations  History of Present Illness:    Joel Thompson is a 39 y.o. Filipino  male with a hx of chest pain, seen by Dr Allyson Sabal in Oct 2020.  Dr Allyson Sabal did not feel his symptoms were cardiac and no further work up was undertaken.   He presents today again with complaints of chest pain and palpitations.  His symptoms started 2 months ago.  He describes chest "pressure" with exertion and "pounding" heart rate with exertion.  Since we saw th patient last he had a lipid panel done in July which showed an LDL of 163, HDL 40. He also tells me his sister had a heart attack in [place] he has no details about this, he just knows she is OK now.   Past Medical History:  Diagnosis Date  . Allergy   . Cardiac arrhythmia, unspecified   . Hyperlipidemia     No past surgical history on file.  Current Medications: No outpatient medications have been marked as taking for the 12/06/20 encounter (Office Visit) with Abelino Derrick, PA-C.     Allergies:   Patient has no known allergies.   Social History   Socioeconomic History  . Marital status: Single    Spouse name: Not on file  . Number of children: Not on file  . Years of education: Not on file  . Highest education level: Not on file  Occupational History  . Not on file  Tobacco Use  . Smoking status: Never Smoker  . Smokeless tobacco: Never Used  Vaping Use  . Vaping Use: Never used  Substance and Sexual Activity  . Alcohol use: Not on file  . Drug use: Never  . Sexual activity: Not on file  Other Topics Concern  . Not on file  Social History Narrative  . Not on file   Social Determinants of Health   Financial Resource Strain: Not on file  Food Insecurity: Not on file  Transportation  Needs: Not on file  Physical Activity: Not on file  Stress: Not on file  Social Connections: Not on file     Family History: The patient's family history includes Diabetes in his mother; Heart attack (age of onset: 32) in his sister; Hypertension in his father.  ROS:   Please see the history of present illness.     All other systems reviewed and are negative.  EKGs/Labs/Other Studies Reviewed:    The following studies were reviewed today:   EKG:  EKG is ordered today.  The ekg ordered today demonstrates NSR, HR 90  Recent Labs: 06/20/2020: BUN 10; Creat 0.96; Hemoglobin 15.7; Platelets 349; Potassium 4.1; Sodium 139  Recent Lipid Panel    Component Value Date/Time   CHOL 249 (H) 06/20/2020 0823   TRIG 273 (H) 06/20/2020 0823   HDL 40 06/20/2020 0823   CHOLHDL 6.2 (H) 06/20/2020 0823   VLDL 41.0 (H) 06/19/2019 0904   LDLCALC 164 (H) 06/20/2020 0823   LDLDIRECT 159.0 06/19/2019 0904    Physical Exam:    VS:  BP 110/81 (BP Location: Left Arm, Patient Position: Sitting)   Pulse 91   Ht 5\' 4"  (1.626 m)   Wt 105 lb 12.8 oz (48 kg)  SpO2 96%   BMI 18.16 kg/m     Wt Readings from Last 3 Encounters:  12/06/20 105 lb 12.8 oz (48 kg)  10/28/20 108 lb (49 kg)  09/10/20 112 lb (50.8 kg)     GEN: Well nourished, thin, well developed male in no acute distress HEENT: Normal NECK: No JVD; No carotid bruits CARDIAC: RRR, no murmurs, rubs, gallops RESPIRATORY:  Clear to auscultation without rales, wheezing or rhonchi  ABDOMEN: Soft, non-tender, non-distended MUSCULOSKELETAL:  No edema; No deformity  SKIN: Warm and dry NEUROLOGIC:  Alert and oriented x 3 PSYCHIATRIC:  Normal affect   ASSESSMENT:    Chest pain of uncertain etiology Chest "pressure" and palpitations associated with exertion  Dyslipidemia LDL 163 July 2021  Family history of coronary artery disease in sister Sister had an MI in her 64's  PLAN:    POET, echo, f/u dr Allyson Sabal   Medication  Adjustments/Labs and Tests Ordered: Current medicines are reviewed at length with the patient today.  Concerns regarding medicines are outlined above.  Orders Placed This Encounter  Procedures  . EXERCISE TOLERANCE TEST (ETT)  . EKG 12-Lead  . ECHOCARDIOGRAM COMPLETE   No orders of the defined types were placed in this encounter.   There are no Patient Instructions on file for this visit.   Jolene Provost, PA-C  12/06/2020 3:48 PM    [facility]

## 2020-12-26 ENCOUNTER — Other Ambulatory Visit (HOSPITAL_COMMUNITY): Payer: No Typology Code available for payment source

## 2020-12-30 ENCOUNTER — Other Ambulatory Visit (HOSPITAL_COMMUNITY)
Admission: RE | Admit: 2020-12-30 | Discharge: 2020-12-30 | Disposition: A | Discharge status: Home / Self Care | Payer: No Typology Code available for payment source | Source: Ambulatory Visit | Attending: Cardiology | Admitting: Cardiology

## 2020-12-30 ENCOUNTER — Telehealth (HOSPITAL_COMMUNITY): Payer: Self-pay | Admitting: *Deleted

## 2020-12-30 DIAGNOSIS — Z20822 Contact with and (suspected) exposure to covid-19: Secondary | ICD-10-CM | POA: Diagnosis not present

## 2020-12-30 DIAGNOSIS — Z01812 Encounter for preprocedural laboratory examination: Secondary | ICD-10-CM | POA: Diagnosis not present

## 2020-12-30 LAB — SARS CORONAVIRUS 2 (TAT 6-24 HRS): SARS Coronavirus 2: NEGATIVE

## 2020-12-30 NOTE — Telephone Encounter (Signed)
Close encounter 

## 2021-01-03 ENCOUNTER — Telehealth (HOSPITAL_COMMUNITY): Payer: Self-pay | Admitting: *Deleted

## 2021-01-03 ENCOUNTER — Ambulatory Visit (HOSPITAL_COMMUNITY)
Admission: RE | Admit: 2021-01-03 | Discharge: 2021-01-03 | Disposition: A | Discharge status: Home / Self Care | Payer: No Typology Code available for payment source | Source: Ambulatory Visit | Attending: Cardiovascular Disease | Admitting: Cardiovascular Disease

## 2021-01-03 ENCOUNTER — Other Ambulatory Visit: Payer: Self-pay

## 2021-01-03 DIAGNOSIS — R0602 Shortness of breath: Secondary | ICD-10-CM | POA: Diagnosis not present

## 2021-01-03 DIAGNOSIS — R002 Palpitations: Secondary | ICD-10-CM | POA: Diagnosis not present

## 2021-01-03 DIAGNOSIS — R0789 Other chest pain: Secondary | ICD-10-CM | POA: Diagnosis not present

## 2021-01-03 LAB — EXERCISE TOLERANCE TEST
Estimated workload: 10.1 METS
Exercise duration (min): 8 min
Exercise duration (sec): 5 s
MPHR: 183 {beats}/min
Peak HR: 164 {beats}/min
Percent HR: 89 %
Rest HR: 87 {beats}/min

## 2021-01-03 NOTE — Telephone Encounter (Signed)
Close encounter 

## 2021-01-12 ENCOUNTER — Other Ambulatory Visit: Payer: Self-pay

## 2021-01-12 ENCOUNTER — Ambulatory Visit (HOSPITAL_COMMUNITY): Payer: No Typology Code available for payment source | Attending: Cardiology

## 2021-01-12 DIAGNOSIS — R0789 Other chest pain: Secondary | ICD-10-CM | POA: Diagnosis not present

## 2021-01-12 DIAGNOSIS — R002 Palpitations: Secondary | ICD-10-CM | POA: Diagnosis not present

## 2021-01-12 DIAGNOSIS — R0602 Shortness of breath: Secondary | ICD-10-CM | POA: Insufficient documentation

## 2021-01-12 LAB — ECHOCARDIOGRAM COMPLETE
Area-P 1/2: 5.13 cm2
S' Lateral: 2.7 cm

## 2021-01-16 ENCOUNTER — Ambulatory Visit: Payer: No Typology Code available for payment source | Admitting: Pulmonary Disease

## 2021-02-01 ENCOUNTER — Ambulatory Visit (INDEPENDENT_AMBULATORY_CARE_PROVIDER_SITE_OTHER): Payer: No Typology Code available for payment source | Admitting: Pulmonary Disease

## 2021-02-01 ENCOUNTER — Encounter: Payer: Self-pay | Admitting: Pulmonary Disease

## 2021-02-01 ENCOUNTER — Other Ambulatory Visit: Payer: Self-pay

## 2021-02-01 ENCOUNTER — Other Ambulatory Visit (HOSPITAL_COMMUNITY): Payer: Self-pay | Admitting: Pulmonary Disease

## 2021-02-01 VITALS — BP 108/62 | HR 91 | Temp 98.1°F | Ht 64.0 in | Wt 108.4 lb

## 2021-02-01 DIAGNOSIS — G4700 Insomnia, unspecified: Secondary | ICD-10-CM | POA: Diagnosis not present

## 2021-02-01 MED ORDER — ESZOPICLONE 2 MG PO TABS: 2.0000 mg | ORAL_TABLET | Freq: Every evening | ORAL | 2 refills | Status: DC | PRN

## 2021-02-01 MED FILL — ESZOPICLONE 2 MG TAB: 2 | 30 days supply | Qty: 30 | Fill #0

## 2021-02-01 NOTE — Patient Instructions (Signed)
Insomnia  -Trial with Lunesta 2 mg-sent to pharmacy  -Monitor your symptoms, if waking up groggy you can cut it down to 1 mg -if better but not optimal, you may increase it to 3 mg  Follow-up in about 2 months

## 2021-02-01 NOTE — Progress Notes (Signed)
Joel Thompson    [id number]    30-Jan-1981  Primary Care Physician:Jordan, Timoteo Expose, MD  Referring Physician: [place], Betty G, MD [address] [place],  [place] [address]  Chief complaint:   Patient with a history of insomnia  HPI:  Insomnia started around the beginning of the pandemic Does not remember feeling acutely ill  Initially did have sleep onset and sleep maintenance insomnia Presently able to fall asleep easily, does wake up after a few hours of sleep and sometimes unable to go back to sleep  Would usually try to get in bed between 1030 and 11 Takes him just a few minutes to fall asleep Will wake up between 3 and 4 AM and sometimes stays up for 20 to 30 minutes prior to being able to fall asleep Sometimes not able to fall back asleep Usual wake up time is about 6:25 AM  No family history of insomnia  Does not have any prior history of sleep issues  He did have a sleep study recently that was consistent with insomnia, did not show sleep apnea  Pets: Occupation: Exposures: Smoking history: Travel history: Relevant family history:  Outpatient Encounter Medications as of 02/01/2021  Medication Sig  . azelastine (ASTELIN) 0.1 % nasal spray Place 2 sprays into both nostrils 2 (two) times daily. Use in each nostril as directed  . eszopiclone (LUNESTA) 2 MG TABS tablet Take 1 tablet (2 mg total) by mouth at bedtime as needed for sleep. Take immediately before bedtime  . fluticasone (FLONASE) 50 MCG/ACT nasal spray Place 2 sprays into both nostrils at bedtime as needed for allergies or rhinitis. (Patient not taking: Reported on 02/01/2021)   No facility-administered encounter medications on file as of 02/01/2021.    Allergies as of 02/01/2021  . (No Known Allergies)    Past Medical History:  Diagnosis Date  . Allergy   . Cardiac arrhythmia, unspecified   . Hyperlipidemia     No past surgical history on file.  Family History  Problem  Relation Age of Onset  . Diabetes Mother   . Hypertension Father   . Heart attack Sister 19    Social History   Socioeconomic History  . Marital status: Single    Spouse name: Not on file  . Number of children: Not on file  . Years of education: Not on file  . Highest education level: Not on file  Occupational History  . Not on file  Tobacco Use  . Smoking status: Never Smoker  . Smokeless tobacco: Never Used  Vaping Use  . Vaping Use: Never used  Substance and Sexual Activity  . Alcohol use: Not on file  . Drug use: Never  . Sexual activity: Not on file  Other Topics Concern  . Not on file  Social History Narrative  . Not on file   Social Determinants of Health   Financial Resource Strain: Not on file  Food Insecurity: Not on file  Transportation Needs: Not on file  Physical Activity: Not on file  Stress: Not on file  Social Connections: Not on file  Intimate Partner Violence: Not on file    Review of Systems  HENT: Negative for congestion.   Respiratory: Negative for apnea and shortness of breath.   Psychiatric/Behavioral: Negative for agitation.    Vitals:   02/01/21 0937  BP: 108/62  Pulse: 91  Temp: 98.1 F (36.7 C)  SpO2: 97%     Physical Exam  Constitutional:      Appearance: Normal appearance. He is normal weight.  HENT:     Head: Normocephalic.     Nose: No congestion.     Mouth/Throat:     Mouth: Mucous membranes are moist.  Cardiovascular:     Rate and Rhythm: Normal rate and regular rhythm.     Heart sounds: No murmur heard. No friction rub.  Pulmonary:     Effort: No respiratory distress.     Breath sounds: No stridor.  Musculoskeletal:     Cervical back: No rigidity or tenderness.  Neurological:     Mental Status: He is alert.  Psychiatric:        Mood and Affect: Mood normal.    Data Reviewed: Recent sleep study significant for insomnia with very minimal sleep recorded  Assessment:  Chronic insomnia -Did try doxepin in  the past  Low risk for obstructive sleep apnea  Nonrestorative sleep with multiple awakenings  Plan/Recommendations: Trial with Lunesta 2 mg with possibility of titrating the dose higher or lower as needed to maintain restorative sleep and quality of sleep  Encouraged to play close attention to his activity levels during the day and energy levels  Tentative follow-up in 3 months Encouraged to call with any significant concerns Virl Diamond MD [facility] 02/01/2021, 10:20 AM  CC: [place], Betty G, MD

## 2021-02-12 NOTE — Progress Notes (Unsigned)
Cardiology Clinic Note   Patient Name: Trevell Thompson Date of Encounter: 02/14/2021  Primary Care Provider:  [place], Betty G, MD Primary Cardiologist:  Nanetta Batty, MD  Patient Profile    Joel Thompson 40 year old male presents to the clinic today for follow-up evaluation of his atypical chest pain.  Past Medical History    Past Medical History:  Diagnosis Date  . Allergy   . Cardiac arrhythmia, unspecified   . Hyperlipidemia    History reviewed. No pertinent surgical history.  Allergies  No Known Allergies  History of Present Illness    Joel Thompson has a PMH of palpitations, atypical chest pain, family history of coronary artery disease, and dyslipidemia.  He he had previously been seen by Dr. Allyson Sabal 10/20.  At that time his symptoms did not appear to be related to cardiac conditions and no further work-up was pursued.  He presented to the clinic on December 06, 2020.  During that time he reported his sister had  a heart attack in the [place]  He was unable to provide details surrounding her heart attack.  He reported that his symptoms had started around 2 months ago.  He described chest pressure with exertion and pounding heart rate with exertion.  His stress test showed no ischemia and PVCs.  His echocardiogram showed normal LVEF and trivial aortic valve regurgitation.  He presents the clinic today for follow-up evaluation states he feels well.  He reports that he is not noticing as many palpitations at this time.  He has not noticed any episodes of chest pain.  His blood pressure at home is running in the 110s systolically.  We reviewed his stress test and echocardiogram.  He expressed understanding.  I asked him to avoid triggers for palpitations such as caffeine, chocolate, EtOH, dehydration, and over-the-counter decongestants.  We will have him follow-up in 6-9 months with Dr. Allyson Sabal.  Today he denies chest pain, shortness of breath, lower extremity edema, fatigue,  palpitations, melena, hematuria, hemoptysis, diaphoresis, weakness, presyncope, syncope, orthopnea, and PND.   Home Medications    Prior to Admission medications   Medication Sig Start Date End Date Taking? Authorizing Provider  azelastine (ASTELIN) 0.1 % nasal spray Place 2 sprays into both nostrils 2 (two) times daily. Use in each nostril as directed 08/09/20   Karie Fetch P, DO  eszopiclone (LUNESTA) 2 MG TABS tablet Take 1 tablet (2 mg total) by mouth at bedtime as needed for sleep. Take immediately before bedtime 02/01/21   Olalere, Onnie Boer A, MD  fluticasone (FLONASE) 50 MCG/ACT nasal spray Place 2 sprays into both nostrils at bedtime as needed for allergies or rhinitis. Patient not taking: Reported on 02/01/2021 08/18/19   [place], Betty G, MD    Family History    Family History  Problem Relation Age of Onset  . Diabetes Mother   . Hypertension Father   . Heart attack Sister 59   He indicated that his mother is alive. He indicated that his father is alive. He indicated that the status of his sister is unknown.  Social History    Social History   Socioeconomic History  . Marital status: Single    Spouse name: Not on file  . Number of children: Not on file  . Years of education: Not on file  . Highest education level: Not on file  Occupational History  . Not on file  Tobacco Use  . Smoking status: Never Smoker  . Smokeless tobacco: Never Used  Vaping Use  .  Vaping Use: Never used  Substance and Sexual Activity  . Alcohol use: Not on file  . Drug use: Never  . Sexual activity: Not on file  Other Topics Concern  . Not on file  Social History Narrative  . Not on file   Social Determinants of Health   Financial Resource Strain: Not on file  Food Insecurity: Not on file  Transportation Needs: Not on file  Physical Activity: Not on file  Stress: Not on file  Social Connections: Not on file  Intimate Partner Violence: Not on file     Review of Systems     General:  No chills, fever, night sweats or weight changes.  Cardiovascular:  No chest pain, dyspnea on exertion, edema, orthopnea, palpitations, paroxysmal nocturnal dyspnea. Dermatological: No rash, lesions/masses Respiratory: No cough, dyspnea Urologic: No hematuria, dysuria Abdominal:   No nausea, vomiting, diarrhea, bright red blood per rectum, melena, or hematemesis Neurologic:  No visual changes, wkns, changes in mental status. All other systems reviewed and are otherwise negative except as noted above.  Physical Exam    VS:  BP 112/60 (BP Location: Left Arm, Patient Position: Sitting, Cuff Size: Normal)   Pulse 73   Wt 106 lb 3.2 oz (48.2 kg)   SpO2 97%   BMI 18.23 kg/m  , BMI Body mass index is 18.23 kg/m. GEN: Well nourished, well developed, in no acute distress. HEENT: normal. Neck: Supple, no JVD, carotid bruits, or masses. Cardiac: RRR, no murmurs, rubs, or gallops. No clubbing, cyanosis, edema.  Radials/DP/PT 2+ and equal bilaterally.  Respiratory:  Respirations regular and unlabored, clear to auscultation bilaterally. GI: Soft, nontender, nondistended, BS + x 4. MS: no deformity or atrophy. Skin: warm and dry, no rash. Neuro:  Strength and sensation are intact. Psych: Normal affect.  Accessory Clinical Findings    Recent Labs: 06/20/2020: BUN 10; Creat 0.96; Hemoglobin 15.7; Platelets 349; Potassium 4.1; Sodium 139   Recent Lipid Panel    Component Value Date/Time   CHOL 249 (H) 06/20/2020 0823   TRIG 273 (H) 06/20/2020 0823   HDL 40 06/20/2020 0823   CHOLHDL 6.2 (H) 06/20/2020 0823   VLDL 41.0 (H) 06/19/2019 0904   LDLCALC 164 (H) 06/20/2020 0823   LDLDIRECT 159.0 06/19/2019 0904    ECG personally reviewed by me today-none today.  Echocardiogram 01/12/2021 IMPRESSIONS    1. Left ventricular ejection fraction, by estimation, is 55 to 60%. The  left ventricle has normal function. The left ventricle has no regional  wall motion abnormalities. Left  ventricular diastolic parameters were  normal.  2. Right ventricular systolic function is normal. The right ventricular  size is normal. There is normal pulmonary artery systolic pressure.  3. The mitral valve is normal in structure. Trivial mitral valve  regurgitation. No evidence of mitral stenosis.  4. The aortic valve is tricuspid. Aortic valve regurgitation is trivial.  No aortic stenosis is present.   Comparison(s): No prior Echocardiogram.    Exercise tolerance test 01/03/2021  Good exercise capacity, achieved 10.1 METS  Frequent PVCs during stress/recovery  Upsloping ST segment depression was noted during stress. No evidence of ischemia.    Assessment & Plan   1.  Atypical chest pain-no chest pain today.  Underwent ETT 01/03/2021.  Details above.  Echocardiogram 01/12/2021 showed normal LVEF and trivial aortic valve regurgitation. Reassured that his chest discomfort was not related to cardiac conditions. Heart healthy low-sodium diet Increase physical activity as tolerated  Palpitations-notes occasional episodes of brief heart palpitations.  Less frequent than prior visit.  Noted to have PVCs at the conclusion of his exercise tolerance test. Avoid triggers caffeine, chocolate, EtOH, decongestants, dehydration etc.  Dyslipidemia-LDL 163 7/21. Heart healthy low-sodium high-fiber diet Increase physical activity as tolerated Follows with PCP  Disposition: Follow-up with Dr. Allyson Sabal in 6-9 months.  Thomasene Ripple. Cleaver NP-C    02/14/2021, 10:27 AM [facility]  [address] Office [phone] Fax [phone]  Notice: This dictation was prepared with Dragon dictation along with smaller phrase technology. Any transcriptional errors that result from this process are unintentional and may not be corrected upon review.  I spent 12 minutes examining this patient, reviewing medications, and using patient centered shared decision making  involving her cardiac care.  Prior to her visit I spent greater than 20 minutes reviewing her past medical history,  medications, and prior cardiac tests.

## 2021-02-14 ENCOUNTER — Other Ambulatory Visit: Payer: Self-pay

## 2021-02-14 ENCOUNTER — Encounter: Payer: Self-pay | Admitting: General Practice

## 2021-02-14 ENCOUNTER — Ambulatory Visit (INDEPENDENT_AMBULATORY_CARE_PROVIDER_SITE_OTHER): Payer: No Typology Code available for payment source | Admitting: General Practice

## 2021-02-14 VITALS — BP 112/60 | HR 73 | Wt 106.2 lb

## 2021-02-14 DIAGNOSIS — E785 Hyperlipidemia, unspecified: Secondary | ICD-10-CM

## 2021-02-14 DIAGNOSIS — R002 Palpitations: Secondary | ICD-10-CM

## 2021-02-14 DIAGNOSIS — R0789 Other chest pain: Secondary | ICD-10-CM | POA: Diagnosis not present

## 2021-02-14 NOTE — Patient Instructions (Signed)
Medication Instructions:  Continue current medications  *If you need a refill on your cardiac medications before your next appointment, please call your pharmacy*   Lab Work: None Ordered   Testing/Procedures: None Ordered   Follow-Up: At [facility], you and your health needs are our priority.  As part of our continuing mission to provide you with exceptional heart care, we have created designated Provider Care Teams.  These Care Teams include your primary Cardiologist (physician) and Advanced Practice Providers (APPs -  Physician Assistants and Nurse Practitioners) who all work together to provide you with the care you need, when you need it.  We recommend signing up for the patient portal called "MyChart".  Sign up information is provided on this After Visit Summary.  MyChart is used to connect with patients for Virtual Visits (Telemedicine).  Patients are able to view lab/test results, encounter notes, upcoming appointments, etc.  Non-urgent messages can be sent to your provider as well.   To learn more about what you can do with MyChart, go to [email or web address].    Your next appointment:   6-9 month(s)  The format for your next appointment:   In Person  Provider:   You may see Nanetta Batty, MD or one of the following Advanced Practice Providers on your designated Care Team:    Caroline, PA-C  Edd Fabian, FNP

## 2021-03-02 MED FILL — ESZOPICLONE 2 MG TAB: 2 | 30 days supply | Qty: 30 | Fill #1

## 2021-03-21 ENCOUNTER — Telehealth: Payer: Self-pay | Admitting: Cardiovascular Disease

## 2021-03-21 NOTE — Telephone Encounter (Signed)
Pt c/o of Chest Pain: STAT if CP now or developed within 24 hours  1. Are you having CP right now?  No   2. Are you experiencing any other symptoms (ex. SOB, nausea, vomiting, sweating)? Having symptoms of fatigue  3. How long have you been experiencing CP? Within last 24 hours  4. Is your CP continuous or coming and going? Coming and going   5. Have you taken Nitroglycerin? No  ?

## 2021-03-21 NOTE — Telephone Encounter (Signed)
Patient called into day. Stating he has pressure that comes a goes especially when he is walking  And goes away when he stops.-  Pressure is on left side of the chest - no radiation , no nausea , no diaphoresis , is having fatigue "fades Away".  Patient states he has been having pressure off and on, fatique since last Tuesday, Wednesday and thursday .  Patient states he did not go to work last Tuesday or Wednesday due to his fatigue and  Actually went ER for evaluation 03/14/21. Patient had an appointment  02/11/21 with cardiology - no changes made at that time.

## 2021-03-21 NOTE — Telephone Encounter (Signed)
Spoke to patient . Reviewing with Edd Fabian NP  Recommendation following up with primary for evaluation . Patient verbalized understanding.

## 2021-06-21 ENCOUNTER — Ambulatory Visit (INDEPENDENT_AMBULATORY_CARE_PROVIDER_SITE_OTHER): Payer: No Typology Code available for payment source | Admitting: Family Medicine

## 2021-06-21 ENCOUNTER — Encounter: Payer: Self-pay | Admitting: Family Medicine

## 2021-06-21 ENCOUNTER — Other Ambulatory Visit: Payer: Self-pay

## 2021-06-21 VITALS — BP 110/70 | HR 90 | Resp 16 | Ht 64.0 in | Wt 110.0 lb

## 2021-06-21 DIAGNOSIS — Z0000 Encounter for general adult medical examination without abnormal findings: Secondary | ICD-10-CM | POA: Diagnosis not present

## 2021-06-21 DIAGNOSIS — Z13228 Encounter for screening for other metabolic disorders: Secondary | ICD-10-CM

## 2021-06-21 DIAGNOSIS — Z1329 Encounter for screening for other suspected endocrine disorder: Secondary | ICD-10-CM | POA: Diagnosis not present

## 2021-06-21 DIAGNOSIS — Z130 Encounter for screening for diseases of the blood and blood-forming organs and certain disorders involving the immune mechanism: Secondary | ICD-10-CM

## 2021-06-21 DIAGNOSIS — E782 Mixed hyperlipidemia: Secondary | ICD-10-CM | POA: Diagnosis not present

## 2021-06-21 LAB — BASIC METABOLIC PANEL
BUN: 10 mg/dL (ref 6–23)
CO2: 29 mEq/L (ref 19–32)
Calcium: 9.5 mg/dL (ref 8.4–10.5)
Chloride: 101 mEq/L (ref 96–112)
Creatinine, Ser: 0.95 mg/dL (ref 0.40–1.50)
GFR: 102.24 mL/min (ref >60.00)
Glucose, Bld: 99 mg/dL (ref 70–99)
Potassium: 4 mEq/L (ref 3.5–5.1)
Sodium: 137 mEq/L (ref 135–145)

## 2021-06-21 LAB — LIPID PANEL
Cholesterol: 221 mg/dL — ABNORMAL HIGH (ref 0–200)
HDL: 36.2 mg/dL — ABNORMAL LOW (ref >39.00)
NonHDL: 185.04
Total CHOL/HDL Ratio: 6
Triglycerides: 265 mg/dL — ABNORMAL HIGH (ref 0.0–149.0)
VLDL: 53 mg/dL — ABNORMAL HIGH (ref 0.0–40.0)

## 2021-06-21 LAB — LDL CHOLESTEROL, DIRECT: Direct LDL: 152 mg/dL

## 2021-06-21 LAB — HEMOGLOBIN A1C: Hgb A1c MFr Bld: 5.3 % (ref 4.6–6.5)

## 2021-06-21 NOTE — Patient Instructions (Addendum)
A few things to remember from today's visit:  Routine general medical examination at a health care facility  Screening for lipoid disorders - Plan: Lipid panel  Screening for endocrine, metabolic and immunity disorder - Plan: Basic metabolic panel, Hemoglobin A1c  Do not use My Chart to request refills or for acute issues that need immediate attention.   At least 150 minutes of moderate exercise per week, daily brisk walking for 15-30 min is a good exercise option. Healthy diet low in saturated (animal) fats and sweets and consisting of fresh fruits and vegetables, lean meats such as fish and white chicken and whole grains.  - Vaccines:  Tdap vaccine every 10 years.  Shingles vaccine recommended at age 23, could be given after 38 years of age but not sure about insurance coverage.  Pneumonia vaccines: Pneumovax at 68  -Screening recommendations for low/normal risk males:  Screening for diabetes at age 66 and every 3 years. Earlier screening if cardiovascular risk factors.  Lipid screening at 35 and every 3 years. Screening starts in younger males with cardiovascular risk factors.  Colon cancer screening is now at age 65 but your insurance may not cover until age 6 .screening is recommended age 34.  Prostate cancer screening: some controversy, starts usually at 50: Rectal exam and PSA.  Aortic Abdominal Aneurism once between 50 and 97 years old if ever smoker.  Also recommended:  Dental visit- Brush and floss your teeth twice daily; visit your dentist twice a year. Eye doctor- Get an eye exam at least every 2 years. Helmet use- Always wear a helmet when riding a bicycle, motorcycle, rollerblading or skateboarding. Safe sex- If you may be exposed to sexually transmitted infections, use a condom. Seat belts- Seat belts can save your live; always wear one. Smoke/Carbon Monoxide detectors- These detectors need to be installed on the appropriate level of your home. Replace  batteries at least once a year. Skin cancer- When out in the sun please cover up and use sunscreen 15 SPF or higher. Violence- If anyone is threatening or hurting you, please tell your healthcare provider.  Drink alcohol in moderation- Limit alcohol intake to one drink or less per day. Never drink and drive.  Please be sure medication list is accurate. If a new problem present, please set up appointment sooner than planned today.

## 2021-06-21 NOTE — Progress Notes (Signed)
HPI: Joel Thompson is a 40 y.o.male here today for his routine physical examination. No new problems since his last visit.  Last CPE: 06/20/20. He lives with his father.  Regular exercise 3 or more times per week: He is walking daily 20 min. Following a healthy diet: He eats out but tries to eat healthy in general.  Chronic medical problems: Chronic fatigue,CP,palpitations,and insomnia. Follows with cardiologist and pulmonologist.  Immunization History  Administered Date(s) Administered   Influenza Split 09/09/2020   PFIZER(Purple Top)SARS-COV-2 Vaccination 12/07/2019, 01/07/2020   Tdap 06/19/2019   -Hep C screening: 06/20/2020 NR. Last colon cancer screening: N/A Last prostate ca screening: N/A  Negative for high alcohol intake, tobacco use, or Hx of illicit drug use.  -Concerns and/or follow up today:  Hyperlipidemia: He is not on pharmacologic treatment.  Simvastatin has been recommended in the past.  Component     Latest Ref Rng & Units 06/20/2020  Cholesterol     0 - 200 mg/dL 762 (H)  HDL Cholesterol     >39.00 mg/dL 40  Triglycerides     0.0 - 149.0 mg/dL 263 (H)  LDL Cholesterol (Calc)     mg/dL (calc) 335 (H)  Total CHOL/HDL Ratio      6.2 (H)  Non-HDL Cholesterol (Calc)     <130 mg/dL (calc) 456 (H)   Review of Systems  Constitutional:  Positive for fatigue. Negative for activity change, appetite change, fever and unexpected weight change.  HENT:  Negative for dental problem, nosebleeds, sore throat, trouble swallowing and voice change.   Eyes:  Negative for redness and visual disturbance.  Respiratory:  Negative for cough, shortness of breath and wheezing.   Cardiovascular:  Positive for chest pain (chronic.) and palpitations. Negative for leg swelling.  Gastrointestinal:  Negative for abdominal pain, blood in stool, nausea and vomiting.  Endocrine: Negative for cold intolerance, heat intolerance, polydipsia, polyphagia and polyuria.  Genitourinary:   Negative for decreased urine volume, dysuria, genital sores, hematuria and testicular pain.  Musculoskeletal:  Negative for gait problem and myalgias.  Skin:  Negative for color change and rash.  Allergic/Immunologic: Positive for environmental allergies.  Neurological:  Negative for seizures, syncope, weakness and headaches.  Hematological:  Negative for adenopathy. Does not bruise/bleed easily.  Psychiatric/Behavioral:  Positive for sleep disturbance. Negative for confusion.   All other systems reviewed and are negative.  Current Outpatient Medications on File Prior to Visit  Medication Sig Dispense Refill   azelastine (ASTELIN) 0.1 % nasal spray Place 2 sprays into both nostrils 2 (two) times daily. Use in each nostril as directed 30 mL 12   eszopiclone (LUNESTA) 2 MG TABS tablet TAKE 1 TABLET (2 MG TOTAL) BY MOUTH AT BEDTIME AS NEEDED FOR SLEEP. TAKE IMMEDIATELY BEFORE BEDTIME 30 tablet 2   No current facility-administered medications on file prior to visit.   Past Medical History:  Diagnosis Date   Allergy    Cardiac arrhythmia, unspecified    Hyperlipidemia    History reviewed. No pertinent surgical history.  No Known Allergies  Family History  Problem Relation Age of Onset   Diabetes Mother    Hypertension Father    Heart attack Sister 71   Social History   Socioeconomic History   Marital status: Single    Spouse name: Not on file   Number of children: Not on file   Years of education: Not on file   Highest education level: Not on file  Occupational History   Not on  file  Tobacco Use   Smoking status: Never   Smokeless tobacco: Never  Vaping Use   Vaping Use: Never used  Substance and Sexual Activity   Alcohol use: Not on file   Drug use: Never   Sexual activity: Not on file  Other Topics Concern   Not on file  Social History Narrative   Not on file   Social Determinants of Health   Financial Resource Strain: Not on file  Food Insecurity: Not on file   Transportation Needs: Not on file  Physical Activity: Not on file  Stress: Not on file  Social Connections: Not on file   Vitals:   06/21/21 0754  BP: 110/70  Pulse: 90  Resp: 16  SpO2: 98%   Body mass index is 18.88 kg/m.  Wt Readings from Last 3 Encounters:  06/21/21 110 lb (49.9 kg)  02/14/21 106 lb 3.2 oz (48.2 kg)  02/01/21 108 lb 6.4 oz (49.2 kg)   Physical Exam Vitals and nursing note reviewed.  Constitutional:      General: He is not in acute distress.    Appearance: He is well-developed and normal weight.  HENT:     Head: Normocephalic and atraumatic.     Right Ear: Tympanic membrane, ear canal and external ear normal.     Left Ear: Tympanic membrane, ear canal and external ear normal.     Mouth/Throat:     Mouth: Mucous membranes are moist.     Pharynx: Oropharynx is clear.  Eyes:     Extraocular Movements: Extraocular movements intact.     Conjunctiva/sclera: Conjunctivae normal.     Pupils: Pupils are equal, round, and reactive to light.  Neck:     Thyroid: No thyromegaly.     Trachea: No tracheal deviation.  Cardiovascular:     Rate and Rhythm: Normal rate and regular rhythm. Occasional Extrasystoles are present.    Pulses:          Dorsalis pedis pulses are 2+ on the right side and 2+ on the left side.     Heart sounds: No murmur heard. Pulmonary:     Effort: Pulmonary effort is normal. No respiratory distress.     Breath sounds: Normal breath sounds.  Chest:  Breasts:    Right: No supraclavicular adenopathy.     Left: No supraclavicular adenopathy.  Abdominal:     Palpations: Abdomen is soft. There is no hepatomegaly or mass.     Tenderness: There is no abdominal tenderness.  Genitourinary:    Comments: No concerns. Musculoskeletal:        General: No tenderness.     Cervical back: Normal range of motion.     Comments: No major deformities appreciated and no signs of synovitis.  Lymphadenopathy:     Cervical: No cervical adenopathy.      Upper Body:     Right upper body: No supraclavicular adenopathy.     Left upper body: No supraclavicular adenopathy.  Skin:    General: Skin is warm.     Findings: No erythema.  Neurological:     Mental Status: He is alert and oriented to person, place, and time.     Cranial Nerves: No cranial nerve deficit.     Sensory: No sensory deficit.     Coordination: Coordination normal.     Gait: Gait normal.     Deep Tendon Reflexes:     Reflex Scores:      Bicep reflexes are 2+ on the right side  and 2+ on the left side.      Patellar reflexes are 2+ on the right side and 2+ on the left side.  ASSESSMENT AND PLAN:  Calyn was seen today for annual exam.  Diagnoses and all orders for this visit: Lab Results  Component Value Date   CHOL 221 (H) 06/21/2021   HDL 36.20 (L) 06/21/2021   LDLCALC 164 (H) 06/20/2020   LDLDIRECT 152.0 06/21/2021   TRIG 265.0 (H) 06/21/2021   CHOLHDL 6 06/21/2021   Lab Results  Component Value Date   CREATININE 0.95 06/21/2021   BUN 10 06/21/2021   NA 137 06/21/2021   K 4.0 06/21/2021   CL 101 06/21/2021   CO2 29 06/21/2021   Lab Results  Component Value Date   HGBA1C 5.3 06/21/2021   Routine general medical examination at a health care facility We discussed the importance of regular physical activity and healthy diet for prevention of chronic illness and/or complications. Preventive guidelines reviewed. Vaccination up to date.  Next CPE in a year.  Hyperlipidemia, mixed For now continue nonpharmacologic treatment. Further recommendation will be given according to lipid panel results.  Screening for endocrine, metabolic and immunity disorder -     Hemoglobin A1c -     Basic metabolic panel  He has an appointment with cardiologist in 08/2021. Return in 1 year (on 06/21/2022) for CPE.   Damir Leung G. [place], MD  [facility]. [place] office.

## 2021-08-18 ENCOUNTER — Other Ambulatory Visit: Payer: Self-pay

## 2021-08-18 ENCOUNTER — Encounter: Payer: Self-pay | Admitting: Cardiovascular Disease

## 2021-08-18 ENCOUNTER — Other Ambulatory Visit (HOSPITAL_COMMUNITY): Payer: Self-pay

## 2021-08-18 ENCOUNTER — Ambulatory Visit (INDEPENDENT_AMBULATORY_CARE_PROVIDER_SITE_OTHER): Payer: No Typology Code available for payment source | Admitting: Cardiovascular Disease

## 2021-08-18 DIAGNOSIS — R002 Palpitations: Secondary | ICD-10-CM

## 2021-08-18 DIAGNOSIS — E782 Mixed hyperlipidemia: Secondary | ICD-10-CM

## 2021-08-18 DIAGNOSIS — R079 Chest pain, unspecified: Secondary | ICD-10-CM | POA: Diagnosis not present

## 2021-08-18 MED ORDER — ATENOLOL 25 MG PO TABS
12.5000 mg | ORAL_TABLET | Freq: Every day | ORAL | 3 refills | Status: DC
  Filled 2021-08-18: qty 45, 90d supply, fill #0
  Filled 2021-10-20 – 2021-11-01 (×2): qty 45, 90d supply, fill #1
  Filled 2022-02-07: qty 45, 90d supply, fill #2
  Filled 2022-05-07: qty 45, 90d supply, fill #3

## 2021-08-18 NOTE — Assessment & Plan Note (Signed)
History of atypical chest pain.  He does have a family history of heart disease in his sister.  I am going to get a coronary calcium score to further evaluate.

## 2021-08-18 NOTE — Patient Instructions (Signed)
Medication Instructions:  Start Atenolol 12.5 mg daily  *If you need a refill on your cardiac medications before your next appointment, please call your pharmacy*   Lab Work: None ordered today   Testing/Procedures: Dr. Allyson Sabal has ordered a CT coronary calcium score. This test is done at [address]. This is $99 out of pocket.   Coronary CalciumScan A coronary calcium scan is an imaging test used to look for deposits of calcium and other fatty materials (plaques) in the inner lining of the blood vessels of the heart (coronary arteries). These deposits of calcium and plaques can partly clog and narrow the coronary arteries without producing any symptoms or warning signs. This puts a person at risk for a heart attack. This test can detect these deposits before symptoms develop. Tell a health care provider about: Any allergies you have. All medicines you are taking, including vitamins, herbs, eye drops, creams, and over-the-counter medicines. Any problems you or family members have had with anesthetic medicines. Any blood disorders you have. Any surgeries you have had. Any medical conditions you have. Whether you are pregnant or may be pregnant. What are the risks? Generally, this is a safe procedure. However, problems may occur, including: Harm to a pregnant woman and her unborn baby. This test involves the use of radiation. Radiation exposure can be dangerous to a pregnant woman and her unborn baby. If you are pregnant, you generally should not have this procedure done. Slight increase in the risk of cancer. This is because of the radiation involved in the test. What happens before the procedure? No preparation is needed for this procedure. What happens during the procedure? You will undress and remove any jewelry around your neck or chest. You will put on a hospital gown. Sticky electrodes will be placed on your chest. The electrodes will be connected to an  electrocardiogram (ECG) machine to record a tracing of the electrical activity of your heart. A CT scanner will take pictures of your heart. During this time, you will be asked to lie still and hold your breath for 2-3 seconds while a picture of your heart is being taken. The procedure may vary among health care providers and hospitals. What happens after the procedure? You can get dressed. You can return to your normal activities. It is up to you to get the results of your test. Ask your health care provider, or the department that is doing the test, when your results will be ready. Summary A coronary calcium scan is an imaging test used to look for deposits of calcium and other fatty materials (plaques) in the inner lining of the blood vessels of the heart (coronary arteries). Generally, this is a safe procedure. Tell your health care provider if you are pregnant or may be pregnant. No preparation is needed for this procedure. A CT scanner will take pictures of your heart. You can return to your normal activities after the scan is done. This information is not intended to replace advice given to you by your health care provider. Make sure you discuss any questions you have with your health care provider. Document Released: 05/24/2008 Document Revised: 10/15/2016 Document Reviewed: 10/15/2016 Elsevier Interactive Patient Education  2017 [employer].    Follow-Up: At [facility], you and your health needs are our priority.  As part of our continuing mission to provide you with exceptional heart care, we have created designated Provider Care Teams.  These Care Teams include your primary Cardiologist (physician)  and Advanced Practice Providers (APPs -  Physician Assistants and Nurse Practitioners) who all work together to provide you with the care you need, when you need it.  We recommend signing up for the patient portal called "MyChart".  Sign up information is provided on this After Visit  Summary.  MyChart is used to connect with patients for Virtual Visits (Telemedicine).  Patients are able to view lab/test results, encounter notes, upcoming appointments, etc.  Non-urgent messages can be sent to your provider as well.   To learn more about what you can do with MyChart, go to [email or web address].    Your next appointment:   3 month(s)  The format for your next appointment:   In Person  Provider:   Nanetta Batty, MD

## 2021-08-18 NOTE — Assessment & Plan Note (Signed)
History of palpitations most likely related to PVCs which he has on his twelve-lead EKG drinks decaffeinated coffee.  He says he is under some stress in his life.  I am going to begin him on atenolol 12.5 mg a day and we will see him back in 3 months for follow-up.

## 2021-08-18 NOTE — Assessment & Plan Note (Signed)
History of hyperlipidemia not on statin therapy with lipid profile performed 06/21/2021 revealing total cholesterol 221, LDL of 164 and HDL 36.  We will obtain a coronary calcium score to determine how aggressive to be with lipid-lowering therapy.  I suspect he would benefit from being on a statin drug.

## 2021-08-18 NOTE — Progress Notes (Signed)
08/18/2021 Joel Thompson   06/03/1981  [id number]  Primary Physician Thompson, Joel G, MD Primary Cardiologist: Runell Gess MD Joel Thompson, [place]  HPI:  Joel Thompson is a 40 y.o.  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  He was referred by his PCP, Joel Thompson, for cardiac evaluation because of rare/infrequent palpitations.  I last saw him in the office 10/06/2019.  He has no cardiac risk factors.  He was seen at [facility] earlier this year with atypical chest pain thought not to be ischemically mediated.  He had episode of tachypalpitations in July which was self-limited.  He said no recurrent episodes.  During these episodes he had no other symptoms such as dizziness or loss of consciousness.  He drinks 1 cup of coffee a day which now is decaf.  Since I saw him he did have a negative GXT with PVCs in recovery phase 01/04/2021 and a 2D echo performed 01/12/2021 which was entirely normal.  He still complains of some atypical chest pain and occasional palpitations which are annoying to him.     No outpatient medications have been marked as taking for the 08/18/21 encounter (Office Visit) with Runell Gess, MD.     No Known Allergies  Social History   Socioeconomic History   Marital status: Single    Spouse name: Not on file   Number of children: Not on file   Years of education: Not on file   Highest education level: Not on file  Occupational History   Not on file  Tobacco Use   Smoking status: Never   Smokeless tobacco: Never  Vaping Use   Vaping Use: Never used  Substance and Sexual Activity   Alcohol use: Not on file   Drug use: Never   Sexual activity: Not on file  Other Topics Concern   Not on file  Social History Narrative   Not on file   Social Determinants of Health   Financial Resource Strain: Not on file  Food Insecurity: Not on file  Transportation Needs: Not on file  Physical  Activity: Not on file  Stress: Not on file  Social Connections: Not on file  Intimate Partner Violence: Not on file     Review of Systems: General: negative for chills, fever, night sweats or weight changes.  Cardiovascular: negative for chest pain, dyspnea on exertion, edema, orthopnea, palpitations, paroxysmal nocturnal dyspnea or shortness of breath Dermatological: negative for rash Respiratory: negative for cough or wheezing Urologic: negative for hematuria Abdominal: negative for nausea, vomiting, diarrhea, bright red blood per rectum, melena, or hematemesis Neurologic: negative for visual changes, syncope, or dizziness All other systems reviewed and are otherwise negative except as noted above.    Blood pressure 116/62, pulse 84, height 5\' 4"  (1.626 m), weight 109 lb 9.6 oz (49.7 kg), SpO2 97 %.  General appearance: alert and no distress Neck: no adenopathy, no carotid bruit, no JVD, supple, symmetrical, trachea midline, and thyroid not enlarged, symmetric, no tenderness/mass/nodules Lungs: clear to auscultation bilaterally Heart: regular rate and rhythm, S1, S2 normal, no murmur, click, rub or gallop Extremities: extremities normal, atraumatic, no cyanosis or edema Pulses: 2+ and symmetric Skin: Skin color, texture, turgor normal. No rashes or lesions Neurologic: Grossly normal  EKG sinus rhythm at 84 with occasional PVCs.  I personally reviewed this EKG.  ASSESSMENT AND PLAN:   Palpitations History of palpitations most likely related to  PVCs which he has on his twelve-lead EKG drinks decaffeinated coffee.  He says he is under some stress in his life.  I am going to begin him on atenolol 12.5 mg a day and we will see him back in 3 months for follow-up.  Chest pain of uncertain etiology History of atypical chest pain.  He does have a family history of heart disease in his sister.  I am going to get a coronary calcium score to further evaluate.  Hyperlipidemia,  mixed History of hyperlipidemia not on statin therapy with lipid profile performed 06/21/2021 revealing total cholesterol 221, LDL of 164 and HDL 36.  We will obtain a coronary calcium score to determine how aggressive to be with lipid-lowering therapy.  I suspect he would benefit from being on a statin drug.     Runell Gess MD FACP,FACC,FAHA, [facility] 08/18/2021 2:43 PM

## 2021-09-08 ENCOUNTER — Other Ambulatory Visit: Payer: Self-pay

## 2021-09-08 ENCOUNTER — Ambulatory Visit (INDEPENDENT_AMBULATORY_CARE_PROVIDER_SITE_OTHER)
Admission: RE | Admit: 2021-09-08 | Discharge: 2021-09-08 | Disposition: A | Discharge status: Home / Self Care | Payer: Self-pay | Source: Ambulatory Visit | Attending: Cardiovascular Disease | Admitting: Cardiovascular Disease

## 2021-09-08 DIAGNOSIS — R079 Chest pain, unspecified: Secondary | ICD-10-CM

## 2021-10-20 ENCOUNTER — Other Ambulatory Visit (HOSPITAL_COMMUNITY): Payer: Self-pay

## 2021-11-01 ENCOUNTER — Other Ambulatory Visit (HOSPITAL_COMMUNITY): Payer: Self-pay

## 2021-11-24 ENCOUNTER — Other Ambulatory Visit (HOSPITAL_COMMUNITY): Payer: Self-pay

## 2021-11-24 ENCOUNTER — Ambulatory Visit (INDEPENDENT_AMBULATORY_CARE_PROVIDER_SITE_OTHER): Payer: No Typology Code available for payment source | Admitting: Cardiovascular Disease

## 2021-11-24 ENCOUNTER — Other Ambulatory Visit: Payer: Self-pay

## 2021-11-24 ENCOUNTER — Encounter: Payer: Self-pay | Admitting: Cardiovascular Disease

## 2021-11-24 VITALS — BP 114/64 | HR 67 | Ht 64.0 in | Wt 108.6 lb

## 2021-11-24 DIAGNOSIS — Z8249 Family history of ischemic heart disease and other diseases of the circulatory system: Secondary | ICD-10-CM | POA: Diagnosis not present

## 2021-11-24 DIAGNOSIS — E785 Hyperlipidemia, unspecified: Secondary | ICD-10-CM | POA: Diagnosis not present

## 2021-11-24 DIAGNOSIS — R079 Chest pain, unspecified: Secondary | ICD-10-CM | POA: Diagnosis not present

## 2021-11-24 MED ORDER — ATORVASTATIN CALCIUM 20 MG PO TABS
20.0000 mg | ORAL_TABLET | Freq: Every day | ORAL | 3 refills | Status: DC
  Filled 2021-11-24: qty 90, 90d supply, fill #0
  Filled 2022-02-22: qty 90, 90d supply, fill #1
  Filled 2022-05-23: qty 90, 90d supply, fill #2
  Filled 2022-08-22: qty 90, 90d supply, fill #3

## 2021-11-24 NOTE — Progress Notes (Signed)
Joel Thompson returns today for follow-up.  I did do a coronary calcium score on him 09/08/2021 which was entirely normal.  He does get occasional atypical chest pain.  His palpitations have improved.  His lipid profile however does show a total cholesterol of 221 with an LDL of 164.  I am going to begin him on atorvastatin 20 mg a day and we will recheck a lipid liver profile in 3 months.  He will see an APP back in 6 months and me back in 1 year.  Runell Gess, M.D., FACP, [facility], Earl Lagos [facility] [address] [place], [place]  [address]  [phone] 11/24/2021 11:53 AM

## 2021-11-24 NOTE — Patient Instructions (Signed)
Medication Instructions:   -Start atorvastatin (lipitor) 20mg  once daily in the evening.   *If you need a refill on your cardiac medications before your next appointment, please call your pharmacy*   Lab Work: Your physician recommends that you return for lab work in: 3 months for FASTING lipid/liver profile.  If you have labs (blood work) drawn today and your tests are completely normal, you will receive your results only by: MyChart Message (if you have MyChart) OR A paper copy in the mail If you have any lab test that is abnormal or we need to change your treatment, we will call you to review the results.   Follow-Up: At [facility], you and your health needs are our priority.  As part of our continuing mission to provide you with exceptional heart care, we have created designated Provider Care Teams.  These Care Teams include your primary Cardiologist (physician) and Advanced Practice Providers (APPs -  Physician Assistants and Nurse Practitioners) who all work together to provide you with the care you need, when you need it.  We recommend signing up for the patient portal called "MyChart".  Sign up information is provided on this After Visit Summary.  MyChart is used to connect with patients for Virtual Visits (Telemedicine).  Patients are able to view lab/test results, encounter notes, upcoming appointments, etc.  Non-urgent messages can be sent to your provider as well.   To learn more about what you can do with MyChart, go to [facility].    Your next appointment:   6 month(s)  The format for your next appointment:   In Person  Provider:   [email or web address], FNP, Edd Fabian, PA-C, Micah Flesher, PA-C, Marjie Skiff, PA-C, Juanda Crumble, DNP, ANP, or Joni Reining, PA-C       Then, Azalee Course, MD will plan to see you again in 12 month(s).

## 2022-02-08 ENCOUNTER — Other Ambulatory Visit (HOSPITAL_COMMUNITY): Payer: Self-pay

## 2022-02-22 ENCOUNTER — Other Ambulatory Visit (HOSPITAL_COMMUNITY): Payer: Self-pay

## 2022-02-23 LAB — HEPATIC FUNCTION PANEL
ALT: 30 IU/L (ref 0–44)
AST: 16 IU/L (ref 0–40)
Albumin: 5 g/dL (ref 4.0–5.0)
Alkaline Phosphatase: 89 IU/L (ref 44–121)
Bilirubin Total: 0.7 mg/dL (ref 0.0–1.2)
Bilirubin, Direct: 0.15 mg/dL (ref 0.00–0.40)
Total Protein: 7.3 g/dL (ref 6.0–8.5)

## 2022-02-23 LAB — LIPID PANEL
Chol/HDL Ratio: 3.3 ratio (ref 0.0–5.0)
Cholesterol, Total: 140 mg/dL (ref 100–199)
HDL: 42 mg/dL (ref >39)
LDL Chol Calc (NIH): 79 mg/dL (ref 0–99)
Triglycerides: 105 mg/dL (ref 0–149)
VLDL Cholesterol Cal: 19 mg/dL (ref 5–40)

## 2022-03-17 IMAGING — CT CT CARDIAC CORONARY ARTERY CALCIUM SCORE
3 series · 14 of 20 positions shown, 16 images · non-contrast
Comparison: None.
COMPARISON: None.

Addendum:
EXAM:
OVER-READ INTERPRETATION  CT CHEST

The following report is an over-read performed by radiologist Dr.
Yulithza Lizandro [REDACTED] on 09/08/2021. This
over-read does not include interpretation of cardiac or coronary
anatomy or pathology. The coronary calcium score interpretation by
the cardiologist is attached.
CLINICAL DATA: Cardiovascular Disease Risk stratification
Coronary Calcium Score
TECHNIQUE: A gated, non-contrast computed tomography scan of the heart was
performed using 3mm slice thickness. Axial images were analyzed on a
dedicated workstation. Calcium scoring of the coronary arteries was
performed using the Agatston method.

[Series 2: cascseq 2.0 sa36 (id) (id) · axial · 0.39mm/px · z∈[-246,-156]mm · 4 of 76 slices shown]
[im 16/76  vessel]
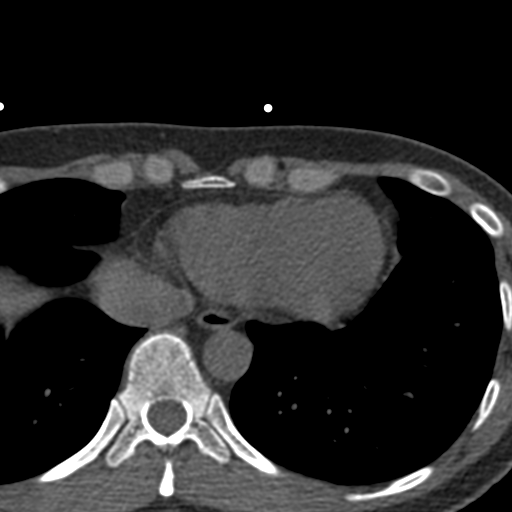
[im 31/76  vessel]
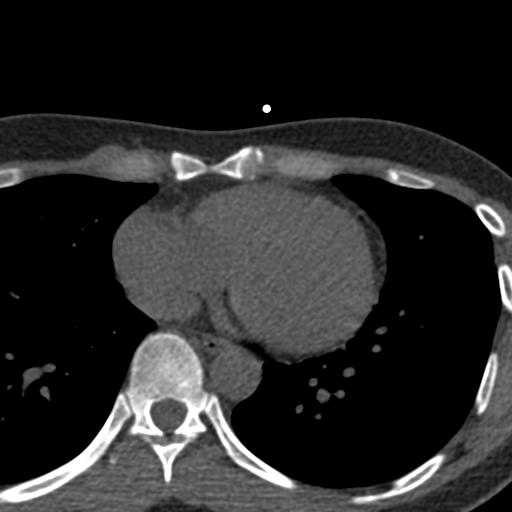
[im 46/76  vessel]
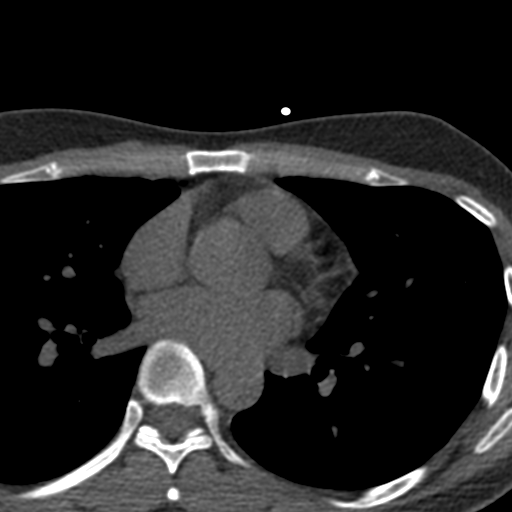
[im 61/76  vessel]
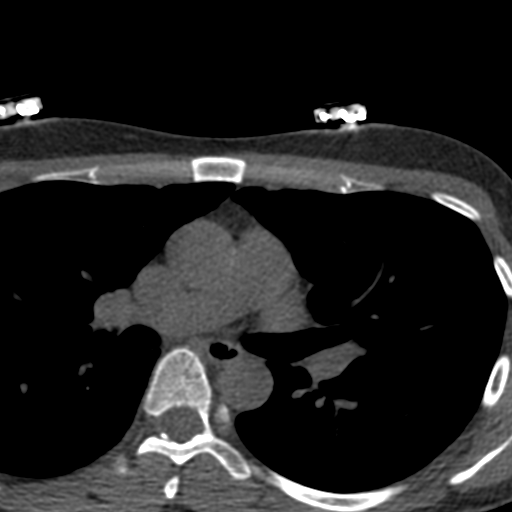

[Series 3: cascseq 2.0 bf37 st · axial · 0.62mm/px · z∈[-252,-152]mm · 5 of 76 slices shown, 7 images]
[im 13/76  vessel]
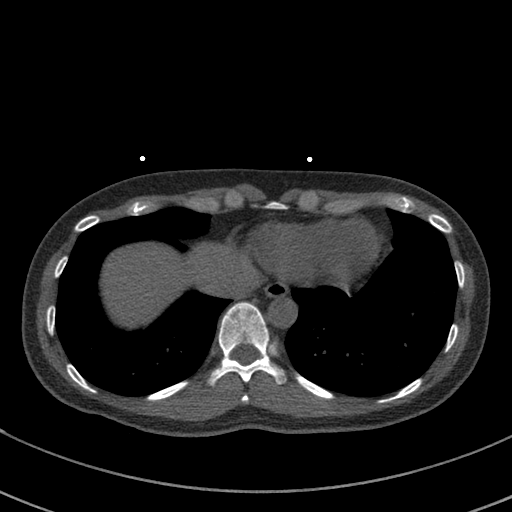
[im 13/76  lung]
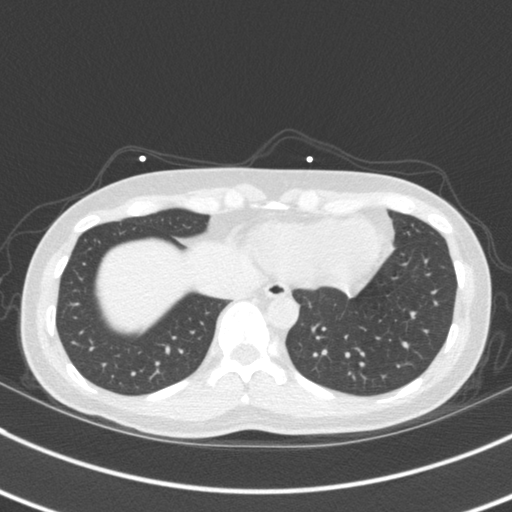
[im 26/76  vessel]
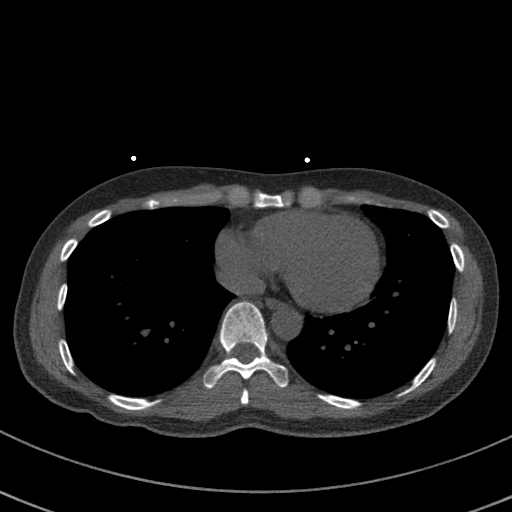
[im 38/76  vessel]
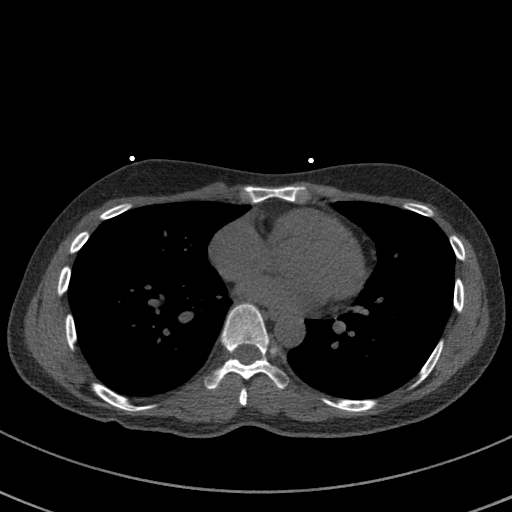
[im 51/76  vessel]
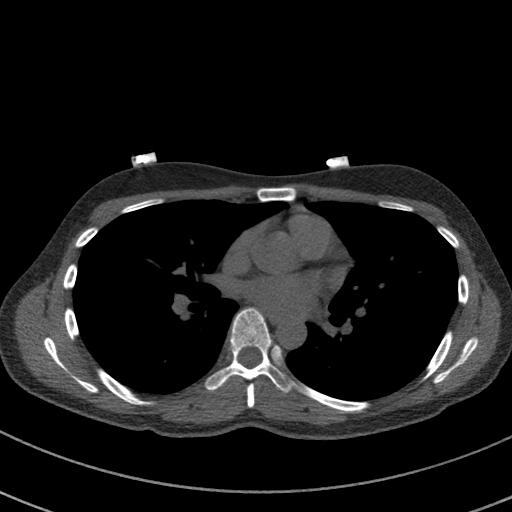
[im 63/76  vessel]
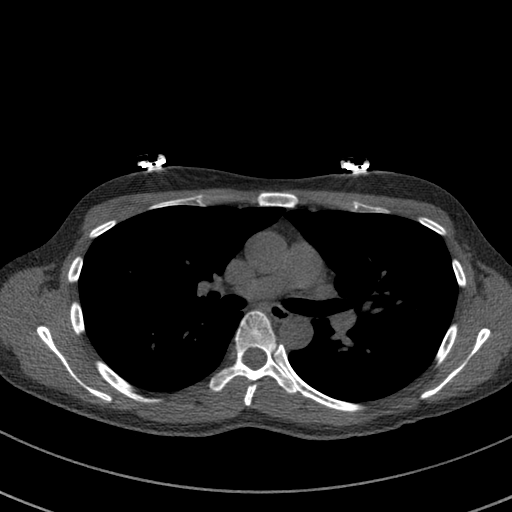
[im 63/76  lung]
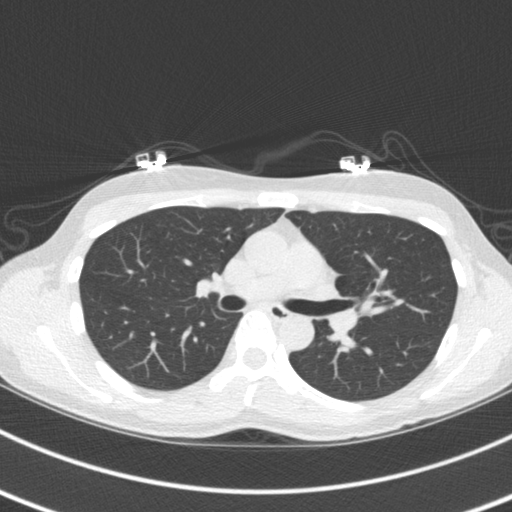

[Series 4: cascseq 2.0 br59 lung · axial · 0.62mm/px · z∈[-252,-152]mm · 5 of 76 slices shown]
[im 13/76  lung]
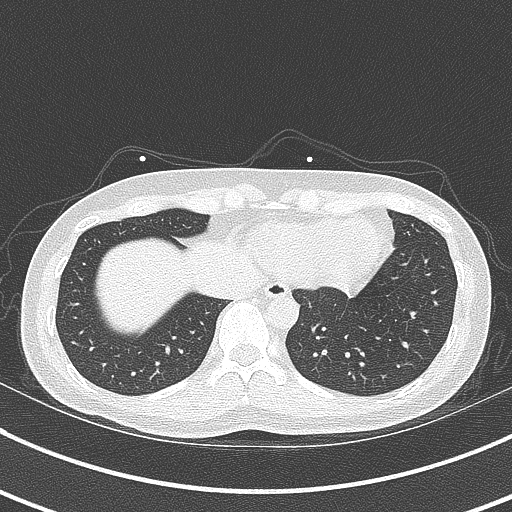
[im 26/76  lung]
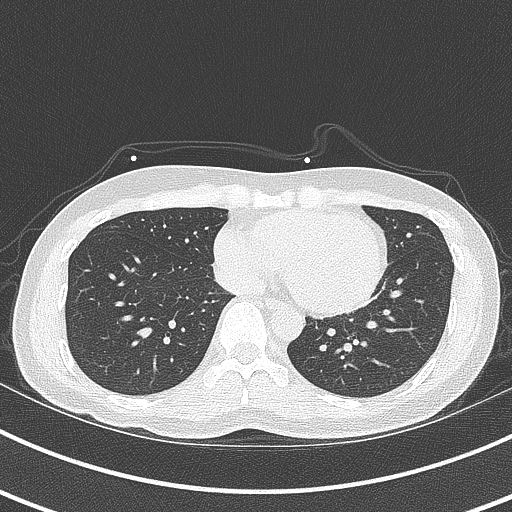
[im 38/76  lung]
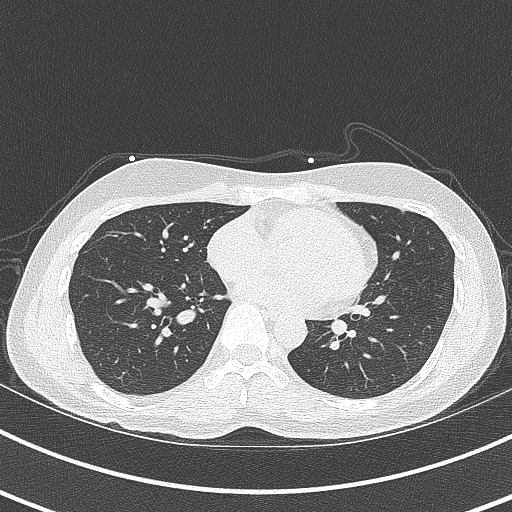
[im 51/76  lung]
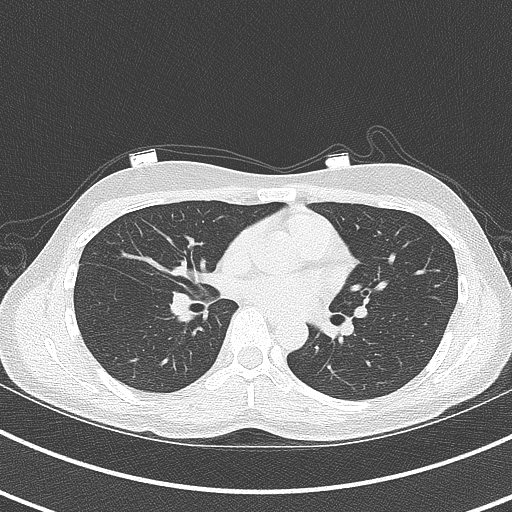
[im 63/76  lung]
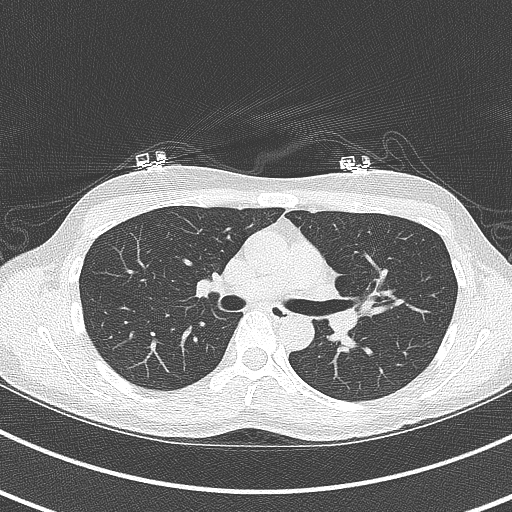

[14 of 20 positions shown; findings below may reference images not displayed]

FINDINGS: Within the visualized portions of the thorax there are no suspicious
appearing pulmonary nodules or masses, there is no acute
consolidative airspace disease, no pleural effusions, no
pneumothorax and no lymphadenopathy. Visualized portions of the
upper abdomen are unremarkable. There are no aggressive appearing
lytic or blastic lesions noted in the visualized portions of the
skeleton.
IMPRESSION: No significant incidental noncardiac findings are noted.
FINDINGS: Coronary Calcium Score:

Left main: 0

Left anterior descending artery: 0

Left circumflex artery: 0

Right coronary artery: 0

Total: 0

Percentile: 0

Pericardium: Normal.

Ascending Aorta: Normal caliber.

Non-cardiac: See separate report from [REDACTED].
IMPRESSION: Coronary calcium score of 0. This was 0 percentile for age-, race-,
and sex-matched controls.



If CAC=0, it is reasonable to withhold statin therapy and reassess
in 5 to 10 years, as long as higher risk conditions are absent
(diabetes mellitus, family history of premature CHD in first degree
relatives (males <55 years; females <65 years), cigarette smoking,
or LDL >=190 mg/dL).

If CAC is 1 to 99, it is reasonable to initiate statin therapy for
patients >=55 years of age.

If CAC is >=100 or >=75th percentile, it is reasonable to initiate
statin therapy at any age.

Cardiology referral should be considered for patients with CAC
scores >=400 or >=75th percentile.

*1347 AHA/ACC/AACVPR/AAPA/ABC/MYRON/TEKENA/ADOLLO/Samb/LIMA AKTER/SAM/NOMASIBULELE
Guideline on the Management of Blood Cholesterol: A Report of the
American College of Cardiology/American Heart Association Task Force
on Clinical Practice Guidelines. J Am Coll Cardiol.
8671;73(24):8522-8977.

*** End of Addendum ***
EXAM:
OVER-READ INTERPRETATION  CT CHEST

The following report is an over-read performed by radiologist Dr.
Yulithza Lizandro [REDACTED] on 09/08/2021. This
over-read does not include interpretation of cardiac or coronary
anatomy or pathology. The coronary calcium score interpretation by
the cardiologist is attached.
FINDINGS: Within the visualized portions of the thorax there are no suspicious
appearing pulmonary nodules or masses, there is no acute
consolidative airspace disease, no pleural effusions, no
pneumothorax and no lymphadenopathy. Visualized portions of the
upper abdomen are unremarkable. There are no aggressive appearing
lytic or blastic lesions noted in the visualized portions of the
skeleton.
IMPRESSION: No significant incidental noncardiac findings are noted.

## 2022-05-08 ENCOUNTER — Other Ambulatory Visit (HOSPITAL_COMMUNITY): Payer: Self-pay

## 2022-05-08 NOTE — Progress Notes (Unsigned)
Cardiology Office Note:    Date:  05/09/2022   ID:  Joel Thompson, DOB 01-18-1981, MRN [id number]  PCP:  Thompson, Joel G, MD   [facility] Providers Cardiologist:  Joel Batty, MD Cardiology APP:  Joel Thompson, [place] { Referring MD: Thompson, Joel G, MD   Chief Complaint  Patient presents with   Follow-up    palpitaitons    History of Present Illness:    Joel Thompson is a 41 y.o. male with a hx of normal coronary calcium score 09/08/2021, hyperlipidemia now on 20 mg Lipitor.  He has been followed by heart care for atypical chest pain.  He had a POET in January 2022 that was normal, but PVCs during the recovery phase..  Echocardiogram February 2022 showed normal LVEF 55 to 60% and no significant valvular disease.  He was seen back with continuation of atypical chest pain and underwent coronary calcium score September 2022 which was normal, 0 coronary calcium reported.  Due to family history of heart disease, he was started on 20 mg Lipitor for hyperlipidemia.  He also reports rare/infrequent palpitations, felt related to PVCs.  He presents today for routine follow-up.  He still having palpitations several times weekly. He thinks this is related to when increasing physical activity. Palpitations have been lasting longer than usual, for 20 min or so. Generally palpitations resolve after 3-4 min of rest. Palpitations seem to be lasting longer in duration starting last week. On exam, likely three PVCs. Obtained EKG with PVCs.  Past Medical History:  Diagnosis Date   Allergy    Cardiac arrhythmia, unspecified    Hyperlipidemia     History reviewed. No pertinent surgical history.  Current Medications: Current Meds  Medication Sig   atorvastatin (LIPITOR) 20 MG tablet Take 1 tablet (20 mg total) by mouth daily.   metoprolol succinate (TOPROL XL) 25 MG 24 hr tablet Take 1 tablet (25 mg total) by mouth daily.   [DISCONTINUED] atenolol (TENORMIN) 25 MG tablet Take 0.5 tablets (12.5  mg total) by mouth daily.   [DISCONTINUED] metoprolol succinate (TOPROL XL) 25 MG 24 hr tablet Take 1 tablet (25 mg total) by mouth daily.     Allergies:   Patient has no known allergies.   Social History   Socioeconomic History   Marital status: Single    Spouse name: Not on file   Number of children: Not on file   Years of education: Not on file   Highest education level: Not on file  Occupational History   Not on file  Tobacco Use   Smoking status: Never   Smokeless tobacco: Never  Vaping Use   Vaping Use: Never used  Substance and Sexual Activity   Alcohol use: Not on file   Drug use: Never   Sexual activity: Not on file  Other Topics Concern   Not on file  Social History Narrative   Not on file   Social Determinants of Health   Financial Resource Strain: Not on file  Food Insecurity: Not on file  Transportation Needs: Not on file  Physical Activity: Not on file  Stress: Not on file  Social Connections: Not on file     Family History: The patient's family history includes Diabetes in his mother; Heart attack (age of onset: 15) in his sister; Hypertension in his father.  ROS:   Please see the history of present illness.     All other systems reviewed and are negative.  EKGs/Labs/Other Studies Reviewed:  The following studies were reviewed today:  Coronary calcium score 2022 Zero    EKG:  EKG is  ordered today.  The ekg ordered today demonstrates sinus HR 63, frequent PVCs   Recent Labs: 06/21/2021: BUN 10; Creatinine, Ser 0.95; Potassium 4.0; Sodium 137 02/22/2022: ALT 30  Recent Lipid Panel    Component Value Date/Time   CHOL 140 02/22/2022 1107   TRIG 105 02/22/2022 1107   HDL 42 02/22/2022 1107   CHOLHDL 3.3 02/22/2022 1107   CHOLHDL 6 06/21/2021 0818   VLDL 53.0 (H) 06/21/2021 0818   LDLCALC 79 02/22/2022 1107   LDLCALC 164 (H) 06/20/2020 0823   LDLDIRECT 152.0 06/21/2021 0818     Risk Assessment/Calculations:           Physical  Exam:    VS:  BP 102/66   Pulse 75   Ht  (1.626 m)   Wt 112 lb 9.6 oz (51.1 kg)   SpO2 98%   BMI 19.33 kg/m     Wt Readings from Last 3 Encounters:  05/09/22 112 lb 9.6 oz (51.1 kg)  11/24/21 108 lb 9.6 oz (49.3 kg)  08/18/21 109 lb 9.6 oz (49.7 kg)     GEN:  Well nourished, well developed in no acute distress HEENT: Normal NECK: No JVD; No carotid bruits LYMPHATICS: No lymphadenopathy CARDIAC: irregular rhythm, regular rate, no murmur RESPIRATORY:  Clear to auscultation without rales, wheezing or rhonchi  ABDOMEN: Soft, non-tender, non-distended MUSCULOSKELETAL:  No edema; No deformity  SKIN: Warm and dry NEUROLOGIC:  Alert and oriented x 3 PSYCHIATRIC:  Normal affect   ASSESSMENT:    1. Palpitations   2. PVC (premature ventricular contraction)   3. Chest pain of uncertain etiology   4. Hyperlipidemia, mixed    PLAN:    In order of problems listed above:  Palpitations PVCs Felt most likely related to PVCs. He avoids caffeine but is under some stress in his life.  He was started on 12.5 mg atenolol daily.  He has not had improvement with this. I will switch to 25 mg toprol to see if this helps. Also recommended trial of 400 mg magnesium. On exam, had three ?PVCs. EKG with frequent PVCs. I will place a monitor to get an idea of PVC burden. He may need EP referral depending on burden.    Atypical chest pain Family history of heart disease in his sister Coronary calcium score was 0   Hyperlipidemia 06/21/2021: VLDL 53.0 02/22/2022: Cholesterol, Total 140; HDL 42; LDL Chol Calc (NIH) 79; Triglycerides 105 Started on 20 mg Lipitor, doing well   Follow up in 5-6 weeks for monitor results. If high burden, may refer straight to EP.   Medication Adjustments/Labs and Tests Ordered: Current medicines are reviewed at length with the patient today.  Concerns regarding medicines are outlined above.  Orders Placed This Encounter  Procedures   LONG TERM MONITOR (3-14  DAYS)   EKG 12-Lead   Meds ordered this encounter  Medications   DISCONTD: metoprolol succinate (TOPROL XL) 25 MG 24 hr tablet    Sig: Take 1 tablet (25 mg total) by mouth daily.    Dispense:  30 tablet    Refill:  11   metoprolol succinate (TOPROL XL) 25 MG 24 hr tablet    Sig: Take 1 tablet (25 mg total) by mouth daily.    Dispense:  90 tablet    Refill:  3    Patient Instructions  Medication Instructions:  STOP Atenolol  as directed.  START Toprol 25 mg daily START OTC Magnesium 400 mg daily (Nature Made) *If you need a refill on your cardiac medications before your next appointment, please call your pharmacy*   Lab Work: NONE ordered at this time of appointment   If you have labs (blood work) drawn today and your tests are completely normal, you will receive your results only by: MyChart Message (if you have MyChart) OR A paper copy in the mail If you have any lab test that is abnormal or we need to change your treatment, we will call you to review the results.   Testing/Procedures: Christena Deem- Long Term Monitor Instructions  Your physician has requested you wear a ZIO patch monitor for 14 days.  This is a single patch monitor. [employer] supplies one patch monitor per enrollment. Additional stickers are not available. Please do not apply patch if you will be having a Nuclear Stress Test,  Echocardiogram, Cardiac CT, MRI, or Chest Xray during the period you would be wearing the  monitor. The patch cannot be worn during these tests. You cannot remove and re-apply the  ZIO XT patch monitor.  Your ZIO patch monitor will be mailed 3 day USPS to your address on file. It may take 3-5 days  to receive your monitor after you have been enrolled.  Once you have received your monitor, please review the enclosed instructions. Your monitor  has already been registered assigning a specific monitor serial # to you.  Billing and Patient Assistance Program Information  We have supplied  [employer] with any of your insurance information on file for billing purposes. [employer] offers a sliding scale Patient Assistance Program for patients that do not have  insurance, or whose insurance does not completely cover the cost of the ZIO monitor.  You must apply for the Patient Assistance Program to qualify for this discounted rate.  To apply, please call [employer] at [phone], select option 4, select option 2, ask to apply for  Patient Assistance Program. Meredeth Ide will ask your household income, and how many people  are in your household. They will quote your out-of-pocket cost based on that information.  [employer] will also be able to set up a 45-month, interest-free payment plan if needed.  Applying the monitor   Shave hair from upper left chest.  Hold abrader disc by orange tab. Rub abrader in 40 strokes over the upper left chest as  indicated in your monitor instructions.  Clean area with 4 enclosed alcohol pads. Let dry.  Apply patch as indicated in monitor instructions. Patch will be placed under collarbone on left  side of chest with arrow pointing upward.  Rub patch adhesive wings for 2 minutes. Remove white label marked "1". Remove the white  label marked "2". Rub patch adhesive wings for 2 additional minutes.  While looking in a mirror, press and release button in center of patch. A small green light will  flash 3-4 times. This will be your only indicator that the monitor has been turned on.  Do not shower for the first 24 hours. You may shower after the first 24 hours.  Press the button if you feel a symptom. You will hear a small click. Record Date, Time and  Symptom in the Patient Logbook.  When you are ready to remove the patch, follow instructions on the last 2 pages of Patient  Logbook. Stick patch monitor onto the last page of Patient Logbook.  Place Patient Logbook in the blue and  white box. Use locking tab on box and tape box closed  securely. The blue and white box  has prepaid postage on it. Please place it in the mailbox as  soon as possible. Your physician should have your test results approximately 7 days after the  monitor has been mailed back to [facility].  Call [facility] Customer Care at [phone] if you have questions regarding  your ZIO XT patch monitor. Call them immediately if you see an orange light blinking on your  monitor.  If your monitor falls off in less than 4 days, contact our Monitor department at [phone].  If your monitor becomes loose or falls off after 4 days call [employer] at [phone] for  suggestions on securing your monitor   Follow-Up: At [facility], you and your health needs are our priority.  As part of our continuing mission to provide you with exceptional heart care, we have created designated Provider Care Teams.  These Care Teams include your primary Cardiologist (physician) and Advanced Practice Providers (APPs -  Physician Assistants and Nurse Practitioners) who all work together to provide you with the care you need, when you need it.  We recommend signing up for the patient portal called "MyChart".  Sign up information is provided on this After Visit Summary.  MyChart is used to connect with patients for Virtual Visits (Telemedicine).  Patients are able to view lab/test results, encounter notes, upcoming appointments, etc.  Non-urgent messages can be sent to your provider as well.   To learn more about what you can do with MyChart, go to [email or web address].    Your next appointment:   4-5 week(s)  The format for your next appointment:   In Person  Provider:   Micah FlesherAngela Laverne Hursey, PA-C        Other Instructions   Important Information About Sugar         Signed, Joel Dusterngela Nicole Keaja Reaume, [place]  05/09/2022 5:15 PM    [facility]

## 2022-05-09 ENCOUNTER — Ambulatory Visit: Payer: No Typology Code available for payment source | Admitting: General Practice

## 2022-05-09 ENCOUNTER — Ambulatory Visit (INDEPENDENT_AMBULATORY_CARE_PROVIDER_SITE_OTHER): Payer: No Typology Code available for payment source

## 2022-05-09 ENCOUNTER — Other Ambulatory Visit (HOSPITAL_COMMUNITY): Payer: Self-pay

## 2022-05-09 ENCOUNTER — Ambulatory Visit (INDEPENDENT_AMBULATORY_CARE_PROVIDER_SITE_OTHER): Payer: No Typology Code available for payment source | Admitting: Physician Assistant

## 2022-05-09 ENCOUNTER — Encounter: Payer: Self-pay | Admitting: Physician Assistant

## 2022-05-09 VITALS — BP 102/66 | HR 75 | Ht 64.0 in | Wt 112.6 lb

## 2022-05-09 DIAGNOSIS — R002 Palpitations: Secondary | ICD-10-CM | POA: Diagnosis not present

## 2022-05-09 DIAGNOSIS — I493 Ventricular premature depolarization: Secondary | ICD-10-CM

## 2022-05-09 DIAGNOSIS — E782 Mixed hyperlipidemia: Secondary | ICD-10-CM | POA: Diagnosis not present

## 2022-05-09 DIAGNOSIS — R079 Chest pain, unspecified: Secondary | ICD-10-CM | POA: Diagnosis not present

## 2022-05-09 MED ORDER — METOPROLOL SUCCINATE ER 25 MG PO TB24
25.0000 mg | ORAL_TABLET | Freq: Every day | ORAL | 11 refills | Status: DC
  Filled 2022-05-09: qty 30, 30d supply, fill #0

## 2022-05-09 MED ORDER — METOPROLOL SUCCINATE ER 25 MG PO TB24
25.0000 mg | ORAL_TABLET | Freq: Every day | ORAL | 3 refills | Status: DC
  Filled 2022-05-09: qty 90, 90d supply, fill #0
  Filled 2022-08-02: qty 90, 90d supply, fill #1
  Filled 2022-10-16: qty 90, 90d supply, fill #2
  Filled 2023-01-13: qty 90, 90d supply, fill #3

## 2022-05-09 NOTE — Patient Instructions (Signed)
Medication Instructions:  STOP Atenolol as directed.  START Toprol 25 mg daily START OTC Magnesium 400 mg daily (Nature Made) *If you need a refill on your cardiac medications before your next appointment, please call your pharmacy*   Lab Work: NONE ordered at this time of appointment   If you have labs (blood work) drawn today and your tests are completely normal, you will receive your results only by: MyChart Message (if you have MyChart) OR A paper copy in the mail If you have any lab test that is abnormal or we need to change your treatment, we will call you to review the results.   Testing/Procedures: Christena Deem- Long Term Monitor Instructions  Your physician has requested you wear a ZIO patch monitor for 14 days.  This is a single patch monitor. [employer] supplies one patch monitor per enrollment. Additional stickers are not available. Please do not apply patch if you will be having a Nuclear Stress Test,  Echocardiogram, Cardiac CT, MRI, or Chest Xray during the period you would be wearing the  monitor. The patch cannot be worn during these tests. You cannot remove and re-apply the  ZIO XT patch monitor.  Your ZIO patch monitor will be mailed 3 day USPS to your address on file. It may take 3-5 days  to receive your monitor after you have been enrolled.  Once you have received your monitor, please review the enclosed instructions. Your monitor  has already been registered assigning a specific monitor serial # to you.  Billing and Patient Assistance Program Information  We have supplied [employer] with any of your insurance information on file for billing purposes. [employer] offers a sliding scale Patient Assistance Program for patients that do not have  insurance, or whose insurance does not completely cover the cost of the ZIO monitor.  You must apply for the Patient Assistance Program to qualify for this discounted rate.  To apply, please call [employer] at [phone], select option  4, select option 2, ask to apply for  Patient Assistance Program. Meredeth Ide will ask your household income, and how many people  are in your household. They will quote your out-of-pocket cost based on that information.  [employer] will also be able to set up a 12-month, interest-free payment plan if needed.  Applying the monitor   Shave hair from upper left chest.  Hold abrader disc by orange tab. Rub abrader in 40 strokes over the upper left chest as  indicated in your monitor instructions.  Clean area with 4 enclosed alcohol pads. Let dry.  Apply patch as indicated in monitor instructions. Patch will be placed under collarbone on left  side of chest with arrow pointing upward.  Rub patch adhesive wings for 2 minutes. Remove white label marked "1". Remove the white  label marked "2". Rub patch adhesive wings for 2 additional minutes.  While looking in a mirror, press and release button in center of patch. A small green light will  flash 3-4 times. This will be your only indicator that the monitor has been turned on.  Do not shower for the first 24 hours. You may shower after the first 24 hours.  Press the button if you feel a symptom. You will hear a small click. Record Date, Time and  Symptom in the Patient Logbook.  When you are ready to remove the patch, follow instructions on the last 2 pages of Patient  Logbook. Stick patch monitor onto the last page of Patient Logbook.  Place Patient  Logbook in the blue and white box. Use locking tab on box and tape box closed  securely. The blue and white box has prepaid postage on it. Please place it in the mailbox as  soon as possible. Your physician should have your test results approximately 7 days after the  monitor has been mailed back to [facility].  Call [facility] Customer Care at [phone] if you have questions regarding  your ZIO XT patch monitor. Call them immediately if you see an orange light blinking on your  monitor.  If  your monitor falls off in less than 4 days, contact our Monitor department at [phone].  If your monitor becomes loose or falls off after 4 days call [employer] at [phone] for  suggestions on securing your monitor   Follow-Up: At [facility], you and your health needs are our priority.  As part of our continuing mission to provide you with exceptional heart care, we have created designated Provider Care Teams.  These Care Teams include your primary Cardiologist (physician) and Advanced Practice Providers (APPs -  Physician Assistants and Nurse Practitioners) who all work together to provide you with the care you need, when you need it.  We recommend signing up for the patient portal called "MyChart".  Sign up information is provided on this After Visit Summary.  MyChart is used to connect with patients for Virtual Visits (Telemedicine).  Patients are able to view lab/test results, encounter notes, upcoming appointments, etc.  Non-urgent messages can be sent to your provider as well.   To learn more about what you can do with MyChart, go to [email or web address].    Your next appointment:   4-5 week(s)  The format for your next appointment:   In Person  Provider:   Micah Flesher, PA-C        Other Instructions   Important Information About Sugar

## 2022-05-09 NOTE — Progress Notes (Unsigned)
Enrolled patient for a 14 day Zio XT monitor to be mailed to patients home   Dr Berry to read 

## 2022-05-12 DIAGNOSIS — R002 Palpitations: Secondary | ICD-10-CM

## 2022-05-24 ENCOUNTER — Other Ambulatory Visit (HOSPITAL_COMMUNITY): Payer: Self-pay

## 2022-06-01 ENCOUNTER — Telehealth: Payer: Self-pay | Admitting: Cardiovascular Disease

## 2022-06-04 ENCOUNTER — Telehealth: Payer: Self-pay | Admitting: Physician Assistant

## 2022-06-04 NOTE — Telephone Encounter (Signed)
Patient has appointment with A. Duke, PA-C on 6/29.

## 2022-06-05 NOTE — Progress Notes (Signed)
Cardiology Office Note:    Date:  06/07/2022   ID:  Joel Thompson, DOB [birth year], MRN [id number]  PCP:  [place], Betty G, MD   [facility] Providers Cardiologist:  Nanetta Batty, MD Cardiology APP:  Marcelino Duster, [place] { Referring MD: [place], Betty G, MD   Chief Complaint  Patient presents with   Follow-up    Heart monitor, PVCs, chest pain, blurry vision    History of Present Illness:    Joel Thompson is a 41 y.o. male with a hx of Joel Thompson is a 41 y.o. male with a hx of normal coronary calcium score 09/08/2021, hyperlipidemia now on 20 mg Lipitor.  He has been followed by heart care for atypical chest pain.  He had a POET in January 2022 that was normal, but PVCs during the recovery phase..  Echocardiogram February 2022 showed normal LVEF 55 to 60% and no significant valvular disease.  He was seen back with continuation of atypical chest pain and underwent coronary calcium score September 2022 which was normal, 0 coronary calcium reported.  Due to family history of heart disease, he was started on 20 mg Lipitor for hyperlipidemia.  He also reports rare/infrequent palpitations, felt related to PVCs.  When I saw him in follow-up 05/09/2022 he reported more frequent palpitations that are lasting longer in duration  To this end, I recommended a 2-week ZIO monitor and switched his atenolol to metoprolol.  Unfortunately he visited the ER 05/28/2022 with left-sided chest pain x24 hours.  Work-up was largely negative including D-dimer, troponin,, CBC, and CMP.  He was discharged without admission.  He called our office stating that he was having some blurry vision and believes this was due to the change from atenolol to metoprolol.  He was added to my schedule.  He went to the [facility] ER on 05/28/22 due to abnormal breathing and chest pressure.  He noted that he was short of breath and also took his metoprolol 1.5 hrs late. He also notes one day of blurry vision, which has since improved. He  does not have blurry vision today. No associated symptoms, not necessarily correlated with a sleepless night.  He reports interrupted sleep since 2020 - he reports waking several times per night and has been told he has insomnia. He completed a sleep study for sleep apnea, but he couldn't sleep, so no data collected.   In review of preliminary results of his heart monitor, it looks like he is having approximately 17% PVCs.   Past Medical History:  Diagnosis Date   Allergy    Cardiac arrhythmia, unspecified    Hyperlipidemia     History reviewed. No pertinent surgical history.  Current Medications: Current Meds  Medication Sig   atorvastatin (LIPITOR) 20 MG tablet Take 1 tablet (20 mg total) by mouth daily.   Magnesium 400 MG CAPS Take by mouth.   metoprolol succinate (TOPROL XL) 25 MG 24 hr tablet Take 1 tablet (25 mg total) by mouth daily.     Allergies:   Patient has no known allergies.   Social History   Socioeconomic History   Marital status: Single    Spouse name: Not on file   Number of children: Not on file   Years of education: Not on file   Highest education level: Not on file  Occupational History   Not on file  Tobacco Use   Smoking status: Never   Smokeless tobacco: Never  Vaping Use   Vaping Use: Never used  Substance and Sexual Activity   Alcohol use: Not on file   Drug use: Never   Sexual activity: Not on file  Other Topics Concern   Not on file  Social History Narrative   Not on file   Social Determinants of Health   Financial Resource Strain: Not on file  Food Insecurity: Not on file  Transportation Needs: Not on file  Physical Activity: Not on file  Stress: Not on file  Social Connections: Not on file     Family History: The patient's family history includes Diabetes in his mother; Heart attack (age of onset: 54) in his sister; Hypertension in his father.  ROS:   Please see the history of present illness.     All other systems  reviewed and are negative.  EKGs/Labs/Other Studies Reviewed:    The following studies were reviewed today:  Coronary calcium score 2022 Zero    EKG:  EKG is  ordered today.  The ekg ordered today demonstrates sinus rhythm HR 65 PVCs  Recent Labs: 06/21/2021: BUN 10; Creatinine, Ser 0.95; Potassium 4.0; Sodium 137 02/22/2022: ALT 30  Recent Lipid Panel    Component Value Date/Time   CHOL 140 02/22/2022 1107   TRIG 105 02/22/2022 1107   HDL 42 02/22/2022 1107   CHOLHDL 3.3 02/22/2022 1107   CHOLHDL 6 06/21/2021 0818   VLDL 53.0 (H) 06/21/2021 0818   LDLCALC 79 02/22/2022 1107   LDLCALC 164 (H) 06/20/2020 0823   LDLDIRECT 152.0 06/21/2021 0818     Risk Assessment/Calculations:      STOP-Bang Score:  2       Physical Exam:    VS:  BP 110/76 (BP Location: Left Arm)   Pulse 65   Ht 5\' 4"  (1.626 m)   Wt 111 lb (50.3 kg)   SpO2 97%   BMI 19.05 kg/m     Wt Readings from Last 3 Encounters:  06/07/22 111 lb (50.3 kg)  05/09/22 112 lb 9.6 oz (51.1 kg)  11/24/21 108 lb 9.6 oz (49.3 kg)     GEN:  Well nourished, well developed in no acute distress HEENT: Normal NECK: No JVD; No carotid bruits LYMPHATICS: No lymphadenopathy CARDIAC: irregular rhythm, regular rate, no murmurs, rubs, gallops RESPIRATORY:  Clear to auscultation without rales, wheezing or rhonchi  ABDOMEN: Soft, non-tender, non-distended MUSCULOSKELETAL:  No edema; No deformity  SKIN: Warm and dry NEUROLOGIC:  Alert and oriented x 3 PSYCHIATRIC:  Normal affect   ASSESSMENT:    1. PVC (premature ventricular contraction)   2. Palpitations   3. Sleep apnea, unspecified type   4. Atypical chest pain   5. Blurry vision   6. Insomnia, unspecified type   7. Dyslipidemia    PLAN:    In order of problems listed above:  Palpitations Suspect PVC related He avoids caffeine but is under some stress in his life.  He was started on 12.5 mg atenolol daily, but continued to have palpitations. I switched him  to toprol 25 mg without much improvement in palpitations. HR in the 60s, will not increase BB.   PVCs Heart monitor shows 17% PVCs not controlled with BB Given that he is symptomatic, I will refer to EP He was advised to get a sleep study - could not complete due to insomnia in the lab Will order a home sleep study - itamar StopBang score 2   Atypical chest pain Family history of heart disease in his sister Coronary calcium score was 0 Chest pain prompted  recent ER visit at [facility] - troponin and D-dimer negative   Blurry vision Insomnia Insomnia predates toprol I do not think blurry vision was caused by toprol Will refer him back to PCP for these   Hyperlipidemia 06/21/2021: VLDL 53.0 02/22/2022: Cholesterol, Total 140; HDL 42; LDL Chol Calc (NIH) 79; Triglycerides 105 Started on 20 mg Lipitor   Follow up with EP, Dr. Allyson Sabal in 4 months.      Medication Adjustments/Labs and Tests Ordered: Current medicines are reviewed at length with the patient today.  Concerns regarding medicines are outlined above.  Orders Placed This Encounter  Procedures   Ambulatory referral to Cardiac Electrophysiology   EKG 12-Lead   Itamar Sleep Study   No orders of the defined types were placed in this encounter.   Patient Instructions  Medication Instructions:  No Changes *If you need a refill on your cardiac medications before your next appointment, please call your pharmacy*   Lab Work: No Labs If you have labs (blood work) drawn today and your tests are completely normal, you will receive your results only by: MyChart Message (if you have MyChart) OR A paper copy in the mail If you have any lab test that is abnormal or we need to change your treatment, we will call you to review the results.   Testing/Procedures:  Your physician has recommended that you have a sleep study. This test records several body functions during sleep, including: brain activity, eye movement, oxygen and  carbon dioxide blood levels, heart rate and rhythm, breathing rate and rhythm, the flow of air through your mouth and nose, snoring, body muscle movements, and chest and belly movement.    Follow-Up: At [facility], you and your health needs are our priority.  As part of our continuing mission to provide you with exceptional heart care, we have created designated Provider Care Teams.  These Care Teams include your primary Cardiologist (physician) and Advanced Practice Providers (APPs -  Physician Assistants and Nurse Practitioners) who all work together to provide you with the care you need, when you need it.  We recommend signing up for the patient portal called "MyChart".  Sign up information is provided on this After Visit Summary.  MyChart is used to connect with patients for Virtual Visits (Telemedicine).  Patients are able to view lab/test results, encounter notes, upcoming appointments, etc.  Non-urgent messages can be sent to your provider as well.   To learn more about what you can do with MyChart, go to [email or web address].    Your next appointment:   4 month(s)  The format for your next appointment:   In Person  Provider:   Nanetta Batty, MD       Important Information About Sugar         Signed, Marcelino Duster, [place]  06/07/2022 3:12 PM    [facility]

## 2022-06-07 ENCOUNTER — Encounter: Payer: Self-pay | Admitting: Physician Assistant

## 2022-06-07 ENCOUNTER — Ambulatory Visit (INDEPENDENT_AMBULATORY_CARE_PROVIDER_SITE_OTHER): Payer: No Typology Code available for payment source | Admitting: Physician Assistant

## 2022-06-07 VITALS — BP 110/76 | HR 65 | Ht 64.0 in | Wt 111.0 lb

## 2022-06-07 DIAGNOSIS — R002 Palpitations: Secondary | ICD-10-CM

## 2022-06-07 DIAGNOSIS — H538 Other visual disturbances: Secondary | ICD-10-CM

## 2022-06-07 DIAGNOSIS — I493 Ventricular premature depolarization: Secondary | ICD-10-CM | POA: Diagnosis not present

## 2022-06-07 DIAGNOSIS — G4730 Sleep apnea, unspecified: Secondary | ICD-10-CM | POA: Diagnosis not present

## 2022-06-07 DIAGNOSIS — R0789 Other chest pain: Secondary | ICD-10-CM

## 2022-06-07 DIAGNOSIS — E785 Hyperlipidemia, unspecified: Secondary | ICD-10-CM

## 2022-06-07 DIAGNOSIS — G4700 Insomnia, unspecified: Secondary | ICD-10-CM

## 2022-06-07 NOTE — Patient Instructions (Signed)
Medication Instructions:  No Changes *If you need a refill on your cardiac medications before your next appointment, please call your pharmacy*   Lab Work: No Labs If you have labs (blood work) drawn today and your tests are completely normal, you will receive your results only by: MyChart Message (if you have MyChart) OR A paper copy in the mail If you have any lab test that is abnormal or we need to change your treatment, we will call you to review the results.   Testing/Procedures:  Your physician has recommended that you have a sleep study. This test records several body functions during sleep, including: brain activity, eye movement, oxygen and carbon dioxide blood levels, heart rate and rhythm, breathing rate and rhythm, the flow of air through your mouth and nose, snoring, body muscle movements, and chest and belly movement.    Follow-Up: At [facility], you and your health needs are our priority.  As part of our continuing mission to provide you with exceptional heart care, we have created designated Provider Care Teams.  These Care Teams include your primary Cardiologist (physician) and Advanced Practice Providers (APPs -  Physician Assistants and Nurse Practitioners) who all work together to provide you with the care you need, when you need it.  We recommend signing up for the patient portal called "MyChart".  Sign up information is provided on this After Visit Summary.  MyChart is used to connect with patients for Virtual Visits (Telemedicine).  Patients are able to view lab/test results, encounter notes, upcoming appointments, etc.  Non-urgent messages can be sent to your provider as well.   To learn more about what you can do with MyChart, go to [email or web address].    Your next appointment:   4 month(s)  The format for your next appointment:   In Person  Provider:   Nanetta Batty, MD       Important Information About Sugar

## 2022-06-13 ENCOUNTER — Telehealth: Payer: Self-pay | Admitting: *Deleted

## 2022-06-13 ENCOUNTER — Telehealth: Payer: Self-pay

## 2022-06-13 NOTE — Telephone Encounter (Signed)
Prior Authorization for [employer] sent to [employer].  Per rep no PA is required. Call Reference # [id number].

## 2022-06-13 NOTE — Telephone Encounter (Signed)
Called and left a voice message for patient to give me a call back about his Itamar device. Will call again.

## 2022-06-15 ENCOUNTER — Telehealth: Payer: Self-pay | Admitting: Cardiovascular Disease

## 2022-06-15 NOTE — Telephone Encounter (Signed)
Pt returning CMA's call. Please advise 

## 2022-06-15 NOTE — Telephone Encounter (Signed)
Called patient and left a voice message asking for him to give myself or Romie Levee, CMA a called back in reference to his Itamar sleep watch. Will try calling again

## 2022-06-18 ENCOUNTER — Ambulatory Visit: Payer: No Typology Code available for payment source | Admitting: Physician Assistant

## 2022-06-19 NOTE — Telephone Encounter (Signed)
Tried calling patient again to give PIN information for Itamar watch sleep study. Left a voice message for him to return call to myself or Romie Levee, CMA

## 2022-06-21 ENCOUNTER — Other Ambulatory Visit (HOSPITAL_COMMUNITY): Payer: Self-pay

## 2022-06-21 ENCOUNTER — Ambulatory Visit (INDEPENDENT_AMBULATORY_CARE_PROVIDER_SITE_OTHER): Payer: No Typology Code available for payment source | Admitting: Internal Medicine

## 2022-06-21 ENCOUNTER — Encounter: Payer: Self-pay | Admitting: Internal Medicine

## 2022-06-21 VITALS — BP 96/58 | HR 89 | Ht 64.0 in | Wt 112.2 lb

## 2022-06-21 DIAGNOSIS — R002 Palpitations: Secondary | ICD-10-CM

## 2022-06-21 DIAGNOSIS — I493 Ventricular premature depolarization: Secondary | ICD-10-CM

## 2022-06-21 MED ORDER — FLECAINIDE ACETATE 50 MG PO TABS
50.0000 mg | ORAL_TABLET | Freq: Two times a day (BID) | ORAL | 3 refills | Status: DC
  Filled 2022-06-21: qty 180, 90d supply, fill #0
  Filled 2022-09-11: qty 40, 20d supply, fill #1
  Filled 2022-09-11: qty 140, 70d supply, fill #1

## 2022-06-21 NOTE — Progress Notes (Signed)
HPI Joel Thompson is referred by Dr. Allyson Thompson.  He is a very pleasant 41 year old man who has had palpitations and atypical chest pain for almost 3 years.  He has been treated with beta-blocker therapy with minimal improvement in his symptoms.  He has worn a cardiac monitor that demonstrated almost 14% PVC burden.  No prolonged pauses.  He has been on metoprolol succinate 25 mg daily.  An EKG demonstrates sinus rhythm with PVCs with a left bundle pattern transitioning from negative to positive at lead V5 and with a superior left axis.  He has not had syncope.  He denies shortness of breath.  No peripheral edema.  He has preserved left ventricular systolic function by echo. No Known Allergies   Current Outpatient Medications  Medication Sig Dispense Refill   atorvastatin (LIPITOR) 20 MG tablet Take 1 tablet (20 mg total) by mouth daily. 90 tablet 3   flecainide (TAMBOCOR) 50 MG tablet Take 1 tablet (50 mg total) by mouth 2 (two) times daily. 180 tablet 3   metoprolol succinate (TOPROL XL) 25 MG 24 hr tablet Take 1 tablet (25 mg total) by mouth daily. 90 tablet 3   Magnesium 400 MG CAPS Take by mouth. (Patient not taking: Reported on 06/21/2022)     No current facility-administered medications for this visit.     Past Medical History:  Diagnosis Date   Allergy    Cardiac arrhythmia, unspecified    Hyperlipidemia     ROS:   All systems reviewed and negative except as noted in the HPI.   History reviewed. No pertinent surgical history.   Family History  Problem Relation Age of Onset   Diabetes Mother    Hypertension Father    Heart attack Sister 51     Social History   Socioeconomic History   Marital status: Single    Spouse name: Not on file   Number of children: Not on file   Years of education: Not on file   Highest education level: Not on file  Occupational History   Not on file  Tobacco Use   Smoking status: Never   Smokeless tobacco: Never  Vaping Use    Vaping Use: Never used  Substance and Sexual Activity   Alcohol use: Not on file   Drug use: Never   Sexual activity: Not on file  Other Topics Concern   Not on file  Social History Narrative   Not on file   Social Determinants of Health   Financial Resource Strain: Not on file  Food Insecurity: Not on file  Transportation Needs: Not on file  Physical Activity: Not on file  Stress: Not on file  Social Connections: Not on file  Intimate Partner Violence: Not on file     BP (!) 96/58   Pulse 89   Ht 5\' 4"  (1.626 m)   Wt 112 lb 3.2 oz (50.9 kg)   SpO2 97%   BMI 19.26 kg/m   Physical Exam:  Well appearing NAD HEENT: Unremarkable Neck:  No JVD, no thyromegally Lymphatics:  No adenopathy Back:  No CVA tenderness Lungs:  Clear, with no wheezes. HEART:  IRegular rate rhythm, no murmurs, no rubs, no clicks Abd:  soft, positive bowel sounds, no organomegally, no rebound, no guarding Ext:  2 plus pulses, no edema, no cyanosis, no clubbing Skin:  No rashes no nodules Neuro:  CN II through XII intact, motor grossly intact  EKG -reviewed normal sinus rhythm with PVCs as  noted above.  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  He has a burden of less than 14% PVCs.  In addition to his low-dose Toprol, I have recommended initiation of low-dose flecainide, 50 mg twice a day.  He will return for twelve-lead EKG in a couple of weeks and I will see him back in a couple of months.  If his EKG shows more PVCs then we will uptitrate to 75 mg twice a day.  Once we have his PVCs controlled we will consider an exercise test. 2.  Dyslipidemia -he will continue statin therapy with Lipitor.  Joel Bunting, MD

## 2022-06-21 NOTE — Patient Instructions (Addendum)
Medication Instructions:  Your physician has recommended you make the following change in your medication:  START taking:  Flecainide 50 Mg-  Take one tablet by mouth twice a day.   Lab Work: None ordered.  If you have labs (blood work) drawn today and your tests are completely normal, you will receive your results only by: MyChart Message (if you have MyChart) OR A paper copy in the mail If you have any lab test that is abnormal or we need to change your treatment, we will call you to review the results.   Provider:   You will be schedule for a NURSE VISIT in 2 weeks for an EKG.   You will follow up with Lewayne Bunting, MD in 3 MONTHS;    Important Information About Sugar

## 2022-06-22 NOTE — Telephone Encounter (Signed)
Pt returning nurse's call. Please advise

## 2022-06-22 NOTE — Telephone Encounter (Signed)
Called and made the patient aware that He may proceed with the Itamar Home Sleep Study. PIN # provided to the patient. Patient made aware that He will be contacted after the test has been read with the results and any recommendations. Patient verbalized understanding and thanked me for the call.   

## 2022-06-28 ENCOUNTER — Encounter: Payer: Self-pay | Admitting: Cardiovascular Disease

## 2022-06-28 NOTE — Telephone Encounter (Signed)
Error

## 2022-07-01 ENCOUNTER — Encounter (INDEPENDENT_AMBULATORY_CARE_PROVIDER_SITE_OTHER): Payer: No Typology Code available for payment source | Admitting: Cardiology

## 2022-07-01 DIAGNOSIS — I493 Ventricular premature depolarization: Secondary | ICD-10-CM

## 2022-07-03 NOTE — Progress Notes (Unsigned)
HPI: [name] is a 41 y.o. male, who is here today for her routine physical.  Last CPE: 06/21/21  He walks daily for exercise and also active at work. He eats out about 3 times per week. He doe snot eat vegetables daily, eats mainly chicken.  Chronic medical problems: PVC's,HLD,insomia,and chronic fatigue among some.  Since his last visit he has followed with cardiologist. Evaluated in the ED on 05/28/22 because CP. Heart monitor revealed almost 40% PVC burden. Currently he is on metoprolol succinate 25 mg daily and flecainide 50 mg twice daily. According to pt, he has been referred to EP.  Immunization History  Administered Date(s) Administered   Influenza Split 09/09/2020   Influenza,inj,Quad PF,6+ Mos 09/24/2021   PFIZER(Purple Top)SARS-COV-2 Vaccination 12/07/2019, 01/07/2020   Tdap 06/19/2019   Health Maintenance  Topic Date Due   COVID-19 Vaccine (3 - Pfizer series) 07/19/2022 (Originally 03/03/2020)   HIV Screening  06/20/2024 (Originally 12/08/1998)   INFLUENZA VACCINE  07/10/2022   TETANUS/TDAP  06/18/2029   Hepatitis C Screening  Completed   HPV VACCINES  Aged Out   Lab Results  Component Value Date   CHOL 140 02/22/2022   HDL 42 02/22/2022   LDLCALC 79 02/22/2022   LDLDIRECT 152.0 06/21/2021   TRIG 105 02/22/2022   CHOLHDL 3.3 02/22/2022   She has no concerns today.  Review of Systems  Constitutional:  Negative for activity change, appetite change and fever.  HENT:  Negative for nosebleeds, sore throat and trouble swallowing.   Eyes:  Negative for redness and visual disturbance.  Respiratory:  Negative for cough and wheezing.   Cardiovascular:  Positive for chest pain (atypical and chronic) and palpitations. Negative for leg swelling.  Gastrointestinal:  Negative for abdominal pain, blood in stool, nausea and vomiting.       Negative for changes in bowel habits.  Endocrine: Negative for cold intolerance, heat intolerance, polydipsia, polyphagia  and polyuria.  Genitourinary:  Negative for decreased urine volume, dysuria, genital sores, hematuria and testicular pain.  Musculoskeletal:  Negative for gait problem and myalgias.  Skin:  Negative for color change and rash.  Allergic/Immunologic: Positive for environmental allergies.  Neurological:  Negative for syncope, weakness and headaches.  Hematological:  Negative for adenopathy. Does not bruise/bleed easily.  Psychiatric/Behavioral:  Negative for behavioral problems and confusion.   All other systems reviewed and are negative.  Current Outpatient Medications on File Prior to Visit  Medication Sig Dispense Refill   atorvastatin (LIPITOR) 20 MG tablet Take 1 tablet (20 mg total) by mouth daily. 90 tablet 3   flecainide (TAMBOCOR) 50 MG tablet Take 1 tablet (50 mg total) by mouth 2 (two) times daily. 180 tablet 3   Magnesium 400 MG CAPS Take by mouth.     metoprolol succinate (TOPROL XL) 25 MG 24 hr tablet Take 1 tablet (25 mg total) by mouth daily. 90 tablet 3   No current facility-administered medications on file prior to visit.   Past Medical History:  Diagnosis Date   Allergy    Cardiac arrhythmia, unspecified    Hyperlipidemia    History reviewed. No pertinent surgical history.  No Known Allergies  Family History  Problem Relation Age of Onset   Diabetes Mother    Hypertension Father    Heart attack Sister 49   Social History   Socioeconomic History   Marital status: Single    Spouse name: Not on file   Number of children: Not on file   Years of  education: Not on file   Highest education level: Not on file  Occupational History   Not on file  Tobacco Use   Smoking status: Never   Smokeless tobacco: Never  Vaping Use   Vaping Use: Never used  Substance and Sexual Activity   Alcohol use: Not on file   Drug use: Never   Sexual activity: Not on file  Other Topics Concern   Not on file  Social History Narrative   Not on file   Social Determinants of  Health   Financial Resource Strain: Not on file  Food Insecurity: Not on file  Transportation Needs: Not on file  Physical Activity: Not on file  Stress: Not on file  Social Connections: Not on file   Vitals:   07/04/22 1046  BP: 100/60  Pulse: 60  Resp: 12  SpO2: 98%  Body mass index is 18.97 kg/m. Wt Readings from Last 3 Encounters:  07/04/22 110 lb 8 oz (50.1 kg)  06/21/22 112 lb 3.2 oz (50.9 kg)  06/07/22 111 lb (50.3 kg)   Physical Exam Vitals and nursing note reviewed.  Constitutional:      General: He is not in acute distress.    Appearance: He is well-developed.  HENT:     Head: Normocephalic and atraumatic.     Right Ear: Tympanic membrane, ear canal and external ear normal.     Left Ear: Tympanic membrane, ear canal and external ear normal.  Eyes:     Extraocular Movements: Extraocular movements intact.     Conjunctiva/sclera: Conjunctivae normal.     Pupils: Pupils are equal, round, and reactive to light.  Neck:     Thyroid: No thyroid mass or thyromegaly.  Cardiovascular:     Rate and Rhythm: Normal rate. Rhythm irregular. Occasional Extrasystoles are present.    Pulses:          Dorsalis pedis pulses are 2+ on the right side and 2+ on the left side.     Heart sounds: No murmur heard. Pulmonary:     Effort: Pulmonary effort is normal. No respiratory distress.     Breath sounds: Normal breath sounds.  Abdominal:     Palpations: Abdomen is soft. There is no hepatomegaly or mass.     Tenderness: There is no abdominal tenderness.  Genitourinary:    Comments: No concerns. Musculoskeletal:        General: No tenderness.     Cervical back: Normal range of motion.     Comments: No signs of synovitis.  Lymphadenopathy:     Cervical: No cervical adenopathy.     Upper Body:     Right upper body: No supraclavicular adenopathy.     Left upper body: No supraclavicular adenopathy.  Skin:    General: Skin is warm.     Findings: No erythema.  Neurological:      General: No focal deficit present.     Mental Status: He is alert and oriented to person, place, and time.     Cranial Nerves: No cranial nerve deficit.     Sensory: No sensory deficit.     Gait: Gait normal.     Deep Tendon Reflexes:     Reflex Scores:      Bicep reflexes are 2+ on the right side and 2+ on the left side.      Patellar reflexes are 2+ on the right side and 2+ on the left side. Psychiatric:        Mood and Affect:  Mood and affect normal.   ASSESSMENT AND PLAN:  [name] was here today annual physical examination.  Orders Placed This Encounter  Procedures   POC HgB A1c   Lab Results  Component Value Date   HGBA1C 5.5 07/04/2022   Routine general medical examination at a health care facility We discussed the importance of regular physical activity and healthy diet for prevention of chronic illness and/or complications. Preventive guidelines reviewed. Vaccination up to date. Next CPE in a year.  Screening for endocrine, metabolic and immunity disorder -     POC HgB A1c  Hyperlipidemia, mixed Continue Atorvastatin 20 mg daily. Following with cardiologist.  Return in about 1 year (around 07/05/2023).  Ellana Kawa G. [place], MD  [facility]. [place] office.

## 2022-07-03 NOTE — Procedures (Signed)
   SLEEP STUDY REPORT Patient Information Study Date: 07/01/22 Patient Name: Joel Thompson Patient ID: [id number] Birth Date: 08/14/1981 Age: 41 Gender: Male BMI: 26.6 (W=110 lb, H=4' 6'') Referring Physician: Micah Flesher, PA  TEST DESCRIPTION: Home sleep apnea testing was completed using the WatchPat, a Type 1 device, utilizing peripheral arterial tonometry (PAT), chest movement, actigraphy, pulse oximetry, pulse rate, body position and snore. AHI was calculated with apnea and hypopnea using valid sleep time as the denominator. RDI includes apneas, hypopneas, and RERAs. The data acquired and the scoring of sleep and all associated events were performed in accordance with the recommended standards and specifications as outlined in the AASM Manual for the Scoring of Sleep and Associated Events 2.2.0 (2015).  FINDINGS: 1. No evidence of Obstructive Sleep Apnea with AHI 1.0/hr. 2. No Central Sleep Apnea. 3. Oxygen desaturations as low as 86%. 4. Mild to moderate snoring was present. O2 sats were < 88% for 0 minutes. 5. Total sleep time was 6 hrs and 57 min. 6. 21.5% of total sleep time was spent in REM sleep. 7. Normal sleep onset latency at 18 min. 8. Shortened REM sleep onset latency at 38 min. 9. Total awakenings were 11.  DIAGNOSIS: Normal study with no significant sleep disordered breathing.  RECOMMENDATIONS: 1. Normal study with no significant sleep disordered breathing.  2. Healthy sleep recommendations include: adequate nightly sleep (normal 7-9 hrs/night), avoidance of caffeine after noon and alcohol near bedtime, and maintaining a sleep environment that is cool, dark and quiet.  3. Weight loss for overweight patients is recommended.  4. Snoring recommendations include: weight loss where appropriate, side sleeping, and avoidance of alcohol before bed.  5. Operation of motor vehicle or dangerous equipment must be avoided when feeling drowsy, excessively sleepy,  or mentally fatigued.  6. An ENT consultation which may be useful for specific causes of and possible treatment of bothersome snoring .  7. Weight loss may be of benefit in reducing the severity of snoring.   Signature: Electronically Signed: 07/04/22 Armanda Magic, MD; [facility]; Diplomat, American Board of Sleep Medicine

## 2022-07-04 ENCOUNTER — Ambulatory Visit: Payer: No Typology Code available for payment source

## 2022-07-04 ENCOUNTER — Encounter: Payer: Self-pay | Admitting: Family Medicine

## 2022-07-04 ENCOUNTER — Ambulatory Visit (INDEPENDENT_AMBULATORY_CARE_PROVIDER_SITE_OTHER): Payer: No Typology Code available for payment source | Admitting: Family Medicine

## 2022-07-04 VITALS — BP 100/60 | HR 60 | Resp 12 | Ht 64.0 in | Wt 110.5 lb

## 2022-07-04 DIAGNOSIS — E782 Mixed hyperlipidemia: Secondary | ICD-10-CM | POA: Diagnosis not present

## 2022-07-04 DIAGNOSIS — Z1329 Encounter for screening for other suspected endocrine disorder: Secondary | ICD-10-CM

## 2022-07-04 DIAGNOSIS — Z13228 Encounter for screening for other metabolic disorders: Secondary | ICD-10-CM

## 2022-07-04 DIAGNOSIS — Z130 Encounter for screening for diseases of the blood and blood-forming organs and certain disorders involving the immune mechanism: Secondary | ICD-10-CM | POA: Diagnosis not present

## 2022-07-04 DIAGNOSIS — Z0000 Encounter for general adult medical examination without abnormal findings: Secondary | ICD-10-CM

## 2022-07-04 DIAGNOSIS — I493 Ventricular premature depolarization: Secondary | ICD-10-CM

## 2022-07-04 DIAGNOSIS — G4730 Sleep apnea, unspecified: Secondary | ICD-10-CM

## 2022-07-04 LAB — POCT GLYCOSYLATED HEMOGLOBIN (HGB A1C): Hemoglobin A1C: 5.5 % (ref 4.0–5.6)

## 2022-07-04 NOTE — Patient Instructions (Addendum)
A few things to remember from today's visit:  Routine general medical examination at a health care facility  Hyperlipidemia, mixed  Screening for endocrine, metabolic and immunity disorder Do not use My Chart to request refills or for acute issues that need immediate attention.   Please be sure medication list is accurate. If a new problem present, please set up appointment sooner than planned today.  Health Maintenance, Male Adopting a healthy lifestyle and getting preventive care are important in promoting health and wellness. Ask your health care provider about: The right schedule for you to have regular tests and exams. Things you can do on your own to prevent diseases and keep yourself healthy. What should I know about diet, weight, and exercise? Eat a healthy diet  Eat a diet that includes plenty of vegetables, fruits, low-fat dairy products, and lean protein. Do not eat a lot of foods that are high in solid fats, added sugars, or sodium. Maintain a healthy weight Body mass index (BMI) is a measurement that can be used to identify possible weight problems. It estimates body fat based on height and weight. Your health care provider can help determine your BMI and help you achieve or maintain a healthy weight. Get regular exercise Get regular exercise. This is one of the most important things you can do for your health. Most adults should: Exercise for at least 150 minutes each week. The exercise should increase your heart rate and make you sweat (moderate-intensity exercise). Do strengthening exercises at least twice a week. This is in addition to the moderate-intensity exercise. Spend less time sitting. Even light physical activity can be beneficial. Watch cholesterol and blood lipids Have your blood tested for lipids and cholesterol at 39 years of age, then have this test every 5 years. You may need to have your cholesterol levels checked more often if: Your lipid or cholesterol  levels are high. You are older than 40 years of age. You are at high risk for heart disease. What should I know about cancer screening? Many types of cancers can be detected early and may often be prevented. Depending on your health history and family history, you may need to have cancer screening at various ages. This may include screening for: Colorectal cancer. Prostate cancer. Skin cancer. Lung cancer. What should I know about heart disease, diabetes, and high blood pressure? Blood pressure and heart disease High blood pressure causes heart disease and increases the risk of stroke. This is more likely to develop in people who have high blood pressure readings or are overweight. Talk with your health care provider about your target blood pressure readings. Have your blood pressure checked: Every 3-5 years if you are 27-12 years of age. Every year if you are 24 years old or older. If you are between the ages of 68 and 68 and are a current or former smoker, ask your health care provider if you should have a one-time screening for abdominal aortic aneurysm (AAA). Diabetes Have regular diabetes screenings. This checks your fasting blood sugar level. Have the screening done: Once every three years after age 16 if you are at a normal weight and have a low risk for diabetes. More often and at a younger age if you are overweight or have a high risk for diabetes. What should I know about preventing infection? Hepatitis B If you have a higher risk for hepatitis B, you should be screened for this virus. Talk with your health care provider to find out if  you are at risk for hepatitis B infection. Hepatitis C Blood testing is recommended for: Everyone born from [birth year] through 1963. Anyone with known risk factors for hepatitis C. Sexually transmitted infections (STIs) You should be screened each year for STIs, including gonorrhea and chlamydia, if: You are sexually active and are younger than 39  years of age. You are older than 39 years of age and your health care provider tells you that you are at risk for this type of infection. Your sexual activity has changed since you were last screened, and you are at increased risk for chlamydia or gonorrhea. Ask your health care provider if you are at risk. Ask your health care provider about whether you are at high risk for HIV. Your health care provider may recommend a prescription medicine to help prevent HIV infection. If you choose to take medicine to prevent HIV, you should first get tested for HIV. You should then be tested every 3 months for as long as you are taking the medicine. Follow these instructions at home: Alcohol use Do not drink alcohol if your health care provider tells you not to drink. If you drink alcohol: Limit how much you have to 0-2 drinks a day. Know how much alcohol is in your drink. In the [place] one drink equals one 12 oz bottle of beer (355 mL), one 5 oz glass of wine (148 mL), or one 1 oz glass of hard liquor (44 mL). Lifestyle Do not use any products that contain nicotine or tobacco. These products include cigarettes, chewing tobacco, and vaping devices, such as e-cigarettes. If you need help quitting, ask your health care provider. Do not use street drugs. Do not share needles. Ask your health care provider for help if you need support or information about quitting drugs. General instructions Schedule regular health, dental, and eye exams. Stay current with your vaccines. Tell your health care provider if: You often feel depressed. You have ever been abused or do not feel safe at home. Summary Adopting a healthy lifestyle and getting preventive care are important in promoting health and wellness. Follow your health care provider's instructions about healthy diet, exercising, and getting tested or screened for diseases. Follow your health care provider's instructions on monitoring your cholesterol and blood  pressure. This information is not intended to replace advice given to you by your health care provider. Make sure you discuss any questions you have with your health care provider. Document Revised: 04/17/2021 Document Reviewed: 04/17/2021 Elsevier Patient Education  2023 [employer].

## 2022-07-04 NOTE — Assessment & Plan Note (Signed)
Continue Atorvastatin 20 mg daily. Following with cardiologist.

## 2022-07-05 ENCOUNTER — Ambulatory Visit (INDEPENDENT_AMBULATORY_CARE_PROVIDER_SITE_OTHER): Payer: No Typology Code available for payment source | Admitting: Cardiovascular Disease

## 2022-07-05 VITALS — BP 110/65 | HR 64 | Ht 64.0 in | Wt 112.0 lb

## 2022-07-05 DIAGNOSIS — R002 Palpitations: Secondary | ICD-10-CM

## 2022-07-05 DIAGNOSIS — I493 Ventricular premature depolarization: Secondary | ICD-10-CM | POA: Diagnosis not present

## 2022-07-05 NOTE — Progress Notes (Signed)
   Nurse Visit   Date of Encounter: 07/05/2022 ID: Joel Thompson, DOB 06-24-1981, MRN [id number]  PCP:  [place], Betty G, MD   [facility] Providers Cardiologist:  Nanetta Batty, MD Cardiology APP:  Marcelino Duster, PA {    Visit Details   VS:  BP 110/65 (BP Location: Right Arm, Patient Position: Sitting)   Pulse 64   Ht 5\' 4"  (1.626 m)   Wt 112 lb (50.8 kg)   SpO2 97%   BMI 19.22 kg/m  , BMI Body mass index is 19.22 kg/m.  Wt Readings from Last 3 Encounters:  07/05/22 112 lb (50.8 kg)  07/04/22 110 lb 8 oz (50.1 kg)  06/21/22 112 lb 3.2 oz (50.9 kg)     Reason for visit: Follow up for Flecainide  Performed today: Vitals, EKG, Provider consulted: Dr 06/23/22 , and Education Changes (medications, testing, etc.) : none Length of Visit: 20 minutes Patient reports improvement in symptoms with less frequent palpitations.    Medications Adjustments/Labs and Tests Ordered: EKG shows sinus rhythm with frequent PVCs.     Signed, Clifton James, RN  07/05/2022 2:29 PM

## 2022-07-05 NOTE — Patient Instructions (Signed)
Medication Instructions:  Your physician recommends that you continue on your current medications as directed. Please refer to the Current Medication list given to you today.  *If you need a refill on your cardiac medications before your next appointment, please call your pharmacy*   Follow-Up: At [facility], you and your health needs are our priority.  As part of our continuing mission to provide you with exceptional heart care, we have created designated Provider Care Teams.  These Care Teams include your primary Cardiologist (physician) and Advanced Practice Providers (APPs -  Physician Assistants and Nurse Practitioners) who all work together to provide you with the care you need, when you need it.  We recommend signing up for the patient portal called "MyChart".  Sign up information is provided on this After Visit Summary.  MyChart is used to connect with patients for Virtual Visits (Telemedicine).  Patients are able to view lab/test results, encounter notes, upcoming appointments, etc.  Non-urgent messages can be sent to your provider as well.   To learn more about what you can do with MyChart, go to [email or web address].    Your next appointment:   As scheduled with Dr. Ladona Ridgel

## 2022-07-25 ENCOUNTER — Telehealth: Payer: Self-pay | Admitting: *Deleted

## 2022-07-25 NOTE — Telephone Encounter (Signed)
Left message to return a call to discuss itamar results and recommendations. °

## 2022-07-25 NOTE — Telephone Encounter (Signed)
Patient returned a call to me and was given MD results and recommendations. He states that he had a in lab study done before and could not sleep. Per Epic he had a NPSG  on 09/10/20 read by Fannie Knee which also showed no OSA. Patient informed I will have Dr Mayford Knife to review that report and give further recommendations. Patient agrees with plan and will await a call back.

## 2022-08-02 ENCOUNTER — Other Ambulatory Visit (HOSPITAL_COMMUNITY): Payer: Self-pay

## 2022-08-16 ENCOUNTER — Other Ambulatory Visit (HOSPITAL_COMMUNITY): Payer: Self-pay

## 2022-08-16 ENCOUNTER — Other Ambulatory Visit: Payer: Self-pay | Admitting: Cardiology

## 2022-08-16 DIAGNOSIS — I493 Ventricular premature depolarization: Secondary | ICD-10-CM

## 2022-08-16 DIAGNOSIS — G4730 Sleep apnea, unspecified: Secondary | ICD-10-CM

## 2022-08-16 MED ORDER — ESZOPICLONE 3 MG PO TABS
3.0000 mg | ORAL_TABLET | Freq: Every evening | ORAL | 0 refills | Status: DC
  Filled 2022-08-16: qty 1, 1d supply, fill #0

## 2022-08-16 NOTE — Telephone Encounter (Signed)
Patient returned a second call and was informed per  Dr Mayford Knife that she has reviewed his prior sleep study and recommends him having PSG with sleep aid. Patient agrees to proceed. PA done and approved by [facility]. PA # [id number]. Valid dates 09/10/22 to 10/11/22. Alfonso Patten will be called in to [facility] outpatient  pharmacy per Dr Norris Cross order.

## 2022-08-22 ENCOUNTER — Other Ambulatory Visit (HOSPITAL_COMMUNITY): Payer: Self-pay

## 2022-09-11 ENCOUNTER — Other Ambulatory Visit (HOSPITAL_COMMUNITY): Payer: Self-pay

## 2022-09-21 ENCOUNTER — Ambulatory Visit (HOSPITAL_BASED_OUTPATIENT_CLINIC_OR_DEPARTMENT_OTHER): Payer: No Typology Code available for payment source | Attending: Cardiology | Admitting: Cardiology

## 2022-09-21 DIAGNOSIS — G4730 Sleep apnea, unspecified: Secondary | ICD-10-CM | POA: Diagnosis present

## 2022-09-21 DIAGNOSIS — R0683 Snoring: Secondary | ICD-10-CM | POA: Diagnosis not present

## 2022-09-21 DIAGNOSIS — I493 Ventricular premature depolarization: Secondary | ICD-10-CM | POA: Diagnosis not present

## 2022-09-27 ENCOUNTER — Other Ambulatory Visit (HOSPITAL_COMMUNITY): Payer: Self-pay

## 2022-09-27 ENCOUNTER — Ambulatory Visit: Payer: No Typology Code available for payment source | Attending: Internal Medicine | Admitting: Internal Medicine

## 2022-09-27 ENCOUNTER — Encounter: Payer: Self-pay | Admitting: Internal Medicine

## 2022-09-27 VITALS — BP 98/64 | HR 67 | Ht 64.0 in | Wt 114.4 lb

## 2022-09-27 DIAGNOSIS — R002 Palpitations: Secondary | ICD-10-CM

## 2022-09-27 DIAGNOSIS — I493 Ventricular premature depolarization: Secondary | ICD-10-CM

## 2022-09-27 MED ORDER — FLECAINIDE ACETATE 150 MG PO TABS
75.0000 mg | ORAL_TABLET | Freq: Two times a day (BID) | ORAL | 3 refills | Status: DC
  Filled 2022-09-27: qty 90, 90d supply, fill #0
  Filled 2023-01-13: qty 90, 90d supply, fill #1
  Filled 2023-04-19: qty 90, 90d supply, fill #2

## 2022-09-27 NOTE — Patient Instructions (Addendum)
Medication Instructions:  Your physician has recommended you make the following change in your medication: Flecainide Medication DOSE INCREASE ( 75 Mg )  You will: Take HALF tablet ( 75 Mg ) by mouth two  times a day.    Lab Work: None ordered.  If you have labs (blood work) drawn today and your tests are completely normal, you will receive your results only by: MyChart Message (if you have MyChart) OR A paper copy in the mail If you have any lab test that is abnormal or we need to change your treatment, we will call you to review the results.  Testing/Procedures: None ordered.  Follow-Up: At [facility], you and your health needs are our priority.  As part of our continuing mission to provide you with exceptional heart care, we have created designated Provider Care Teams.  These Care Teams include your primary Cardiologist (physician) and Advanced Practice Providers (APPs -  Physician Assistants and Nurse Practitioners) who all work together to provide you with the care you need, when you need it.  We recommend signing up for the patient portal called "MyChart".  Sign up information is provided on this After Visit Summary.  MyChart is used to connect with patients for Virtual Visits (Telemedicine).  Patients are able to view lab/test results, encounter notes, upcoming appointments, etc.  Non-urgent messages can be sent to your provider as well.   To learn more about what you can do with MyChart, go to [email or web address].    Your next appointment:   Please scheduled a 3 Month follow up with Dr. Scarlette Ar   The format for your next appointment:   In Person  Provider:   Lewayne Bunting, MD{or one of the following Advanced Practice Providers on your designated Care Team:   Francis Dowse, [place] Casimiro Needle "[name]" [place], [place]   Flecainide Tablets What is this medication? FLECAINIDE (FLEK a nide) prevents and treats a fast or irregular heartbeat (arrhythmia). It is often used to treat  a type of arrhythmia known as AFib (atrial fibrillation). It works by slowing down overactive electric signals in the heart, which stabilizes your heart rhythm. It belongs to a group of medications called antiarrhythmics. This medicine may be used for other purposes; ask your health care provider or pharmacist if you have questions. COMMON BRAND NAME(S): Tambocor What should I tell my care team before I take this medication? They need to know if you have any of these conditions: Abnormal levels of potassium in the blood Heart disease including heart rhythm and heart rate problems Kidney or liver disease Recent heart attack An unusual or allergic reaction to flecainide, local anesthetics, other medications, foods, dyes, or preservatives Pregnant or trying to get pregnant Breast-feeding How should I use this medication? Take this medication by mouth with a glass of water. Follow the directions on the prescription label. You can take this medication with or without food. Take your doses at regular intervals. Do not take your medication more often than directed. Do not stop taking this medication suddenly. This may cause serious, heart-related side effects. If your care team wants you to stop the medication, the dose may be slowly lowered over time to avoid any side effects. Talk to your care team regarding the use of this medication in children. While this medication may be prescribed for children as Tydarius Yawn as 1 year of age for selected conditions, precautions do apply. Overdosage: If you think you have taken too much of this medicine contact a poison control  center or emergency room at once. NOTE: This medicine is only for you. Do not share this medicine with others. What if I miss a dose? If you miss a dose, take it as soon as you can. If it is almost time for your next dose, take only that dose. Do not take double or extra doses. What may interact with this medication? Do not take this medication  with any of the following: Amoxapine Arsenic trioxide Certain antibiotics like clarithromycin, erythromycin, gatifloxacin, gemifloxacin, levofloxacin, moxifloxacin, sparfloxacin, or troleandomycin Certain antidepressants called tricyclic antidepressants like amitriptyline, imipramine, or nortriptyline Certain medications to control heart rhythm like disopyramide, encainide, moricizine, procainamide, propafenone, and quinidine Cisapride Delavirdine Droperidol Haloperidol Haw[place]orn Imatinib Levomethadyl Maprotiline Medications for malaria like chloroquine and halofantrine Pentamidine Phenothiazines like chlorpromazine, mesoridazine, prochlorperazine, thioridazine Pimozide Quinine Ranolazine Ritonavir Sertindole This medication may also interact with the following: Cimetidine Dofetilide Medications for angina or high blood pressure Medications to control heart rhythm like amiodarone and digoxin Ziprasidone This list may not describe all possible interactions. Give your health care provider a list of all the medicines, herbs, non-prescription drugs, or dietary supplements you use. Also tell them if you smoke, drink alcohol, or use illegal drugs. Some items may interact with your medicine. What should I watch for while using this medication? Visit your care team for regular checks on your progress. Because your condition and the use of this medication carries some risk, it is a good idea to carry an identification card, necklace or bracelet with details of your condition, medications, and care team. Check your blood pressure and pulse rate regularly. Ask your care team what your blood pressure and pulse rate should be, and when you should contact them. Your care team also may schedule regular blood tests and electrocardiograms to check your progress. You may get drowsy or dizzy. Do not drive, use machinery, or do anything that needs mental alertness until you know how this medication  affects you. Do not stand or sit up quickly, especially if you are an older patient. This reduces the risk of dizzy or fainting spells. Alcohol can make you more dizzy, increase flushing and rapid heartbeats. Avoid alcoholic drinks. What side effects may I notice from receiving this medication? Side effects that you should report to your care team as soon as possible: Allergic reactions--skin rash, itching, hives, swelling of the face, lips, tongue, or throat Heart failure--shortness of breath, swelling of the ankles, feet, or hands, sudden weight gain, unusual weakness or fatigue Heart rhythm changes--fast or irregular heartbeat, dizziness, feeling faint or lightheaded, chest pain, trouble breathing Liver injury--right upper belly pain, loss of appetite, nausea, light-colored stool, dark yellow or brown urine, yellowing skin or eyes, unusual weakness or fatigue Side effects that usually do not require medical attention (report to your care team if they continue or are bothersome): Blurry vision Constipation Dizziness Fatigue Headache Nausea Tremors or shaking This list may not describe all possible side effects. Call your doctor for medical advice about side effects. You may report side effects to FDA at 1-800-FDA-1088. Where should I keep my medication? Keep out of the reach of children and pets. Store at room temperature between 15 and 30 degrees C (59 and 86 degrees F). Protect from light. Keep container tightly closed. Throw away any unused medication after the expiration date. NOTE: This sheet is a summary. It may not cover all possible information. If you have questions about this medicine, talk to your doctor, pharmacist, or health care provider.  2023 Elsevier/Gold Standard (2008-01-17 00:00:00)

## 2022-09-27 NOTE — Progress Notes (Signed)
      HPI Mr. Mars returns today for followup. He is a pleasant 41 yo man with symptomatic PVC"s and has developed atypical chest pain as well. He notes that his flecainide appears to be improving his symptoms. He still feels his heart racing at times but is better. No other complaints.  No Known Allergies   Current Outpatient Medications  Medication Sig Dispense Refill   atorvastatin (LIPITOR) 20 MG tablet Take 1 tablet (20 mg total) by mouth daily. 90 tablet 3   flecainide (TAMBOCOR) 150 MG tablet Take 0.5 tablets (75 mg total) by mouth 2 (two) times daily. 90 tablet 3   Magnesium 400 MG CAPS Take by mouth.     metoprolol succinate (TOPROL XL) 25 MG 24 hr tablet Take 1 tablet (25 mg total) by mouth daily. 90 tablet 3   No current facility-administered medications for this visit.     Past Medical History:  Diagnosis Date   Allergy    Cardiac arrhythmia, unspecified    Hyperlipidemia     ROS:   All systems reviewed and negative except as noted in the HPI.   History reviewed. No pertinent surgical history.   Family History  Problem Relation Age of Onset   Diabetes Mother    Hypertension Father    Heart attack Sister 9     Social History   Socioeconomic History   Marital status: Single    Spouse name: Not on file   Number of children: Not on file   Years of education: Not on file   Highest education level: Not on file  Occupational History   Not on file  Tobacco Use   Smoking status: Never   Smokeless tobacco: Never  Vaping Use   Vaping Use: Never used  Substance and Sexual Activity   Alcohol use: Not on file   Drug use: Never   Sexual activity: Not on file  Other Topics Concern   Not on file  Social History Narrative   Not on file   Social Determinants of Health   Financial Resource Strain: Not on file  Food Insecurity: Not on file  Transportation Needs: Not on file  Physical Activity: Not on file  Stress: Not on file  Social Connections:  Not on file  Intimate Partner Violence: Not on file     BP 98/64   Pulse 67   Ht 5\' 4"  (1.626 m)   Wt 114 lb 6.4 oz (51.9 kg)   SpO2 99%   BMI 19.64 kg/m   Physical Exam:  Well appearing NAD HEENT: Unremarkable Neck:  No JVD, no thyromegally Lymphatics:  No adenopathy Back:  No CVA tenderness Lungs:  Clear with no wheezes HEART:  Regular rate rhythm, no murmurs, no rubs, no clicks Abd:  soft, positive bowel sounds, no organomegally, no rebound, no guarding Ext:  2 plus pulses, no edema, no cyanosis, no clubbing Skin:  No rashes no nodules Neuro:  CN II through XII intact, motor grossly intact  EKG - nsr with PVC's   Assess/Plan:  PVC's - He is better but still having symptoms and I have asked him to increase the flecainide to 75 mg twice daily. Chest pain - atypical. I will defer additional work up to Dr. Gwenlyn Found. Dyslipidemia - he will continue his statin therapy.  Carleene Overlie Jecenia Leamer,MD

## 2022-09-28 ENCOUNTER — Other Ambulatory Visit (HOSPITAL_COMMUNITY): Payer: Self-pay

## 2022-09-30 NOTE — Procedures (Signed)
   Patient Name: Joel Thompson, Joel Thompson Date: 09/21/2022 Gender: Male D.O.B: [birth year] Age (years): 33 Referring Provider: Fransico Him MD, ABSM Height (inches): 64 Interpreting Physician: Fransico Him MD, ABSM Weight (lbs): 114 RPSGT: Baxter Flattery BMI: 20 MRN: [id number] Neck Size: 13.00  CLINICAL INFORMATION Sleep Study Type: NPSG  Indication for sleep study: Snoring, Witnesses Apnea / Gasping During Sleep  Epworth Sleepiness Score: 1  SLEEP STUDY TECHNIQUE As per the AASM Manual for the Scoring of Sleep and Associated Events v2.3 (April 2016) with a hypopnea requiring 4% desaturations.  The channels recorded and monitored were frontal, central and occipital EEG, electrooculogram (EOG), submentalis EMG (chin), nasal and oral airflow, thoracic and abdominal wall motion, anterior tibialis EMG, snore microphone, electrocardiogram, and pulse oximetry.  MEDICATIONS Medications self-administered by patient taken the night of the study : DOXEPIN  SLEEP ARCHITECTURE The study was initiated at 10:13:44 PM and ended at 4:36:20 AM.  Sleep onset time was 15.0 minutes and the sleep efficiency was 78.2%. The total sleep time was 299 minutes.  Stage REM latency was 154.0 minutes.  The patient spent 1.8% of the night in stage N1 sleep, 81.6% in stage N2 sleep, 0.0% in stage N3 and 16.6% in REM.  Alpha intrusion was absent.  Supine sleep was 54.01%.  RESPIRATORY PARAMETERS The overall apnea/hypopnea index (AHI) was 0.4 per hour. There were 0 total apneas, including 0 obstructive, 0 central and 0 mixed apneas. There were 2 hypopneas and 0 RERAs.  The AHI during Stage REM sleep was 1.2 per hour.  AHI while supine was 0.0 per hour.  The mean oxygen saturation was 95.5%. The minimum SpO2 during sleep was 92.0%.  snoring was noted during this study.  CARDIAC DATA The 2 lead EKG demonstrated sinus rhythm. The mean heart rate was 56.7 beats per minute. Other EKG findings include:  PVCs.  LEG MOVEMENT DATA The total PLMS were 0 with a resulting PLMS index of 0.0. Associated arousal with leg movement index was 0.0 .  IMPRESSIONS - No significant obstructive sleep apnea occurred during this study (AHI = 0.4/h). - The patient had minimal or no oxygen desaturation during the study (Min O2 = 92.0%) - No snoring was audible during this study. - PVCs were noted during this study. - Clinically significant periodic limb movements did not occur during sleep. No significant associated arousals.  DIAGNOSIS - Normal Study  RECOMMENDATIONS - Avoid alcohol, sedatives and other CNS depressants that may worsen sleep apnea and disrupt normal sleep architecture. - Sleep hygiene should be reviewed to assess factors that may improve sleep quality. - Weight management and regular exercise should be initiated or continued if appropriate.

## 2022-10-12 ENCOUNTER — Encounter: Payer: Self-pay | Admitting: *Deleted

## 2022-10-12 ENCOUNTER — Telehealth: Payer: Self-pay | Admitting: *Deleted

## 2022-10-12 NOTE — Telephone Encounter (Signed)
-----   Message from Wanda M Waddell, CMA sent at 10/01/2022  8:21 AM EDT -----  ----- Message ----- From: Turner, Traci R, MD Sent: 09/30/2022   8:41 PM EDT To: Cv Div Sleep Studies  Please let patient know that sleep study showed no significant sleep apnea.      

## 2022-10-12 NOTE — Telephone Encounter (Signed)
This encounter was created in error - please disregard.

## 2022-10-12 NOTE — Telephone Encounter (Signed)
-----   Message from Lauralee Evener, [place] sent at 10/01/2022  8:21 AM EDT -----  ----- Message ----- From: Sueanne Margarita, MD Sent: 09/30/2022   8:41 PM EDT To: Cv Div Sleep Studies  Please let patient know that sleep study showed no significant sleep apnea.

## 2022-10-12 NOTE — Telephone Encounter (Signed)
Left detailed message on voicemail and informed patient to call back. 

## 2022-10-16 ENCOUNTER — Other Ambulatory Visit (HOSPITAL_COMMUNITY): Payer: Self-pay

## 2022-10-17 ENCOUNTER — Other Ambulatory Visit (HOSPITAL_COMMUNITY): Payer: Self-pay

## 2022-11-14 ENCOUNTER — Ambulatory Visit
Payer: No Typology Code available for payment source | Attending: Cardiovascular Disease | Admitting: Cardiovascular Disease

## 2022-11-14 ENCOUNTER — Encounter: Payer: Self-pay | Admitting: Cardiovascular Disease

## 2022-11-14 VITALS — BP 100/68 | HR 84 | Ht 64.0 in | Wt 115.0 lb

## 2022-11-14 DIAGNOSIS — E782 Mixed hyperlipidemia: Secondary | ICD-10-CM | POA: Diagnosis not present

## 2022-11-14 DIAGNOSIS — I493 Ventricular premature depolarization: Secondary | ICD-10-CM | POA: Diagnosis not present

## 2022-11-14 DIAGNOSIS — R079 Chest pain, unspecified: Secondary | ICD-10-CM | POA: Diagnosis not present

## 2022-11-14 NOTE — Progress Notes (Signed)
11/14/2022 [name]   1981-04-01  [id number]  Primary Physician [place], Betty G, MD Primary Cardiologist: Runell Gess MD Nicholes Calamity, [place]  HPI:  Joel Thompson is a 41 y.o.  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  He was referred by his PCP, Betty [place], for cardiac evaluation because of rare/infrequent palpitations.  I last saw him in the office 11/24/2021.  He has no cardiac risk factors.  He was seen at [facility] earlier this year with atypical chest pain thought not to be ischemically mediated.  He had episode of tachypalpitations in July which was self-limited.  He said no recurrent episodes.  During these episodes he had no other symptoms such as dizziness or loss of consciousness.  He drinks 1 cup of coffee a day which now is decaf.  He did have a negative GXT with PVCs in recovery phase 01/04/2021 and a 2D echo performed 01/12/2021 which was entirely normal.    Since I saw him a year ago he did have a negative sleep study performed by Dr. Mayford Knife ruling out sleep apnea.  He saw Dr. Ladona Ridgel for symptomatic PVCs which were 60% burden on Zio patch.  Dr. Ladona Ridgel has placed him on flecainide and uptitrated the dose of which has resulted in marked improvement in his symptoms.  He does not drink caffeinated beverages.  Current Meds  Medication Sig   atorvastatin (LIPITOR) 20 MG tablet Take 1 tablet (20 mg total) by mouth daily.   flecainide (TAMBOCOR) 150 MG tablet Take 0.5 tablets (75 mg total) by mouth 2 (two) times daily.   metoprolol succinate (TOPROL XL) 25 MG 24 hr tablet Take 1 tablet (25 mg total) by mouth daily.     No Known Allergies  Social History   Socioeconomic History   Marital status: Single    Spouse name: Not on file   Number of children: Not on file   Years of education: Not on file   Highest education level: Not on file  Occupational History   Not on file  Tobacco Use   Smoking  status: Never   Smokeless tobacco: Never  Vaping Use   Vaping Use: Never used  Substance and Sexual Activity   Alcohol use: Not on file   Drug use: Never   Sexual activity: Not on file  Other Topics Concern   Not on file  Social History Narrative   Not on file   Social Determinants of Health   Financial Resource Strain: Not on file  Food Insecurity: Not on file  Transportation Needs: Not on file  Physical Activity: Not on file  Stress: Not on file  Social Connections: Not on file  Intimate Partner Violence: Not on file     Review of Systems: General: negative for chills, fever, night sweats or weight changes.  Cardiovascular: negative for chest pain, dyspnea on exertion, edema, orthopnea, palpitations, paroxysmal nocturnal dyspnea or shortness of breath Dermatological: negative for rash Respiratory: negative for cough or wheezing Urologic: negative for hematuria Abdominal: negative for nausea, vomiting, diarrhea, bright red blood per rectum, melena, or hematemesis Neurologic: negative for visual changes, syncope, or dizziness All other systems reviewed and are otherwise negative except as noted above.    Blood pressure 100/68, pulse 84, height 5\' 4"  (1.626 m), weight 115 lb (52.2 kg), SpO2 94 %.  General appearance: alert and no distress Neck: no adenopathy, no carotid bruit, no JVD,  supple, symmetrical, trachea midline, and thyroid not enlarged, symmetric, no tenderness/mass/nodules Lungs: clear to auscultation bilaterally Heart: regular rate and rhythm, S1, S2 normal, no murmur, click, rub or gallop Extremities: extremities normal, atraumatic, no cyanosis or edema Pulses: 2+ and symmetric Skin: Skin color, texture, turgor normal. No rashes or lesions Neurologic: Grossly normal  EKG not performed today  ASSESSMENT AND PLAN:   Chest pain of uncertain etiology History of atypical chest pain with a normal GXT and a coronary calcium score of 0.  Unlikely this is  ischemically mediated.  Hyperlipidemia, mixed History of hyperlipidemia on statin therapy with lipid profile performed 02/22/2022 revealing total cholesterol 140, LDL 79 HDL 42.  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  He did see Dr. Ladona Ridgel who placed him on flecainide and recently increased the dose which resulted in marked improvement in his symptoms.     Runell Gess MD FACP,FACC,FAHA, [facility] 11/14/2022 2:20 PM

## 2022-11-14 NOTE — Patient Instructions (Signed)
Medication Instructions:  Your physician recommends that you continue on your current medications as directed. Please refer to the Current Medication list given to you today.  *If you need a refill on your cardiac medications before your next appointment, please call your pharmacy*   Follow-Up: At  [facility], you and your health needs are our priority.  As part of our continuing mission to provide you with exceptional heart care, we have created designated Provider Care Teams.  These Care Teams include your primary Cardiologist (physician) and Advanced Practice Providers (APPs -  Physician Assistants and Nurse Practitioners) who all work together to provide you with the care you need, when you need it.  We recommend signing up for the patient portal called "MyChart".  Sign up information is provided on this After Visit Summary.  MyChart is used to connect with patients for Virtual Visits (Telemedicine).  Patients are able to view lab/test results, encounter notes, upcoming appointments, etc.  Non-urgent messages can be sent to your provider as well.   To learn more about what you can do with MyChart, go to [email or web address].    Your next appointment:   12 month(s)  The format for your next appointment:   In Person  Provider:   Jonathan Berry, MD   

## 2022-11-14 NOTE — Assessment & Plan Note (Signed)
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  He did see Dr. Ladona Ridgel who placed him on flecainide and recently increased the dose which resulted in marked improvement in his symptoms.

## 2022-11-14 NOTE — Assessment & Plan Note (Signed)
History of hyperlipidemia on statin therapy with lipid profile performed 02/22/2022 revealing total cholesterol 140, LDL 79 HDL 42.

## 2022-11-14 NOTE — Assessment & Plan Note (Signed)
History of atypical chest pain with a normal GXT and a coronary calcium score of 0.  Unlikely this is ischemically mediated.

## 2022-12-03 ENCOUNTER — Other Ambulatory Visit: Payer: Self-pay | Admitting: Cardiovascular Disease

## 2022-12-04 ENCOUNTER — Other Ambulatory Visit (HOSPITAL_COMMUNITY): Payer: Self-pay

## 2022-12-04 MED ORDER — ATORVASTATIN CALCIUM 20 MG PO TABS
20.0000 mg | ORAL_TABLET | Freq: Every day | ORAL | 0 refills | Status: DC
  Filled 2022-12-04: qty 90, 90d supply, fill #0

## 2023-01-14 ENCOUNTER — Other Ambulatory Visit: Payer: Self-pay

## 2023-01-14 ENCOUNTER — Other Ambulatory Visit (HOSPITAL_COMMUNITY): Payer: Self-pay

## 2023-01-15 ENCOUNTER — Other Ambulatory Visit (HOSPITAL_COMMUNITY): Payer: Self-pay

## 2023-01-29 ENCOUNTER — Ambulatory Visit: Payer: 59 | Attending: Internal Medicine | Admitting: Internal Medicine

## 2023-01-29 ENCOUNTER — Encounter: Payer: Self-pay | Admitting: Internal Medicine

## 2023-01-29 VITALS — BP 112/60 | HR 72 | Ht 64.0 in | Wt 105.8 lb

## 2023-01-29 DIAGNOSIS — R002 Palpitations: Secondary | ICD-10-CM | POA: Diagnosis not present

## 2023-01-29 DIAGNOSIS — I493 Ventricular premature depolarization: Secondary | ICD-10-CM | POA: Diagnosis not present

## 2023-01-29 NOTE — Patient Instructions (Signed)
Medication Instructions:  Your physician recommends that you continue on your current medications as directed. Please refer to the Current Medication list given to you today.  *If you need a refill on your cardiac medications before your next appointment, please call your pharmacy*  Lab Work: None ordered.  If you have labs (blood work) drawn today and your tests are completely normal, you will receive your results only by: [place] (if you have MyChart) OR A paper copy in the mail If you have any lab test that is abnormal or we need to change your treatment, we will call you to review the results.  Testing/Procedures: None ordered.  Follow-Up: At [facility], you and your health needs are our priority.  As part of our continuing mission to provide you with exceptional heart care, we have created designated Provider Care Teams.  These Care Teams include your primary Cardiologist (physician) and Advanced Practice Providers (APPs -  Physician Assistants and Nurse Practitioners) who all work together to provide you with the care you need, when you need it.  We recommend signing up for the patient portal called "MyChart".  Sign up information is provided on this After Visit Summary.  MyChart is used to connect with patients for Virtual Visits (Telemedicine).  Patients are able to view lab/test results, encounter notes, upcoming appointments, etc.  Non-urgent messages can be sent to your provider as well.   To learn more about what you can do with MyChart, go to [email or web address].    Your next appointment:   1 year(s)  The format for your next appointment:   In Person  Provider:   Cristopher Peru, MD{or one of the following Advanced Practice Providers on your designated Care Team:   Tommye Standard, [place] Legrand Como "[facility]" [place], [place]

## 2023-01-29 NOTE — Progress Notes (Signed)
      HPI Mr. Colvin returns today for followup. He is a pleasant 42 yo man with symptomatic PVC"s and has developed atypical chest pain as well. He notes that his flecainide appears to be improving his symptoms. He still feels his heart racing at times but is better. No other complaints.   No Known Allergies   Current Outpatient Medications  Medication Sig Dispense Refill   atorvastatin (LIPITOR) 20 MG tablet Take 1 tablet (20 mg total) by mouth daily. 90 tablet 0   flecainide (TAMBOCOR) 150 MG tablet Take 0.5 tablets (75 mg total) by mouth 2 (two) times daily. 90 tablet 3   Magnesium 400 MG CAPS Take by mouth.     metoprolol succinate (TOPROL XL) 25 MG 24 hr tablet Take 1 tablet (25 mg total) by mouth daily. 90 tablet 3   No current facility-administered medications for this visit.     Past Medical History:  Diagnosis Date   Allergy    Cardiac arrhythmia, unspecified    Hyperlipidemia     ROS:   All systems reviewed and negative except as noted in the HPI.   History reviewed. No pertinent surgical history.   Family History  Problem Relation Age of Onset   Diabetes Mother    Hypertension Father    Heart attack Sister 89     Social History   Socioeconomic History   Marital status: Single    Spouse name: Not on file   Number of children: Not on file   Years of education: Not on file   Highest education level: Not on file  Occupational History   Not on file  Tobacco Use   Smoking status: Never   Smokeless tobacco: Never  Vaping Use   Vaping Use: Never used  Substance and Sexual Activity   Alcohol use: Not on file   Drug use: Never   Sexual activity: Not on file  Other Topics Concern   Not on file  Social History Narrative   Not on file   Social Determinants of Health   Financial Resource Strain: Not on file  Food Insecurity: Not on file  Transportation Needs: Not on file  Physical Activity: Not on file  Stress: Not on file  Social Connections:  Not on file  Intimate Partner Violence: Not on file     BP 112/60   Pulse 72   Ht 5' 4"$  (1.626 m)   Wt 105 lb 12.8 oz (48 kg)   SpO2 98%   BMI 18.16 kg/m   Physical Exam:  Well appearing NAD HEENT: Unremarkable Neck:  No JVD, no thyromegally Lymphatics:  No adenopathy Back:  No CVA tenderness Lungs:  Clear with no wheezes HEART:  Regular rate rhythm, no murmurs, no rubs, no clicks Abd:  soft, positive bowel sounds, no organomegally, no rebound, no guarding Ext:  2 plus pulses, no edema, no cyanosis, no clubbing Skin:  No rashes no nodules Neuro:  CN II through XII intact, motor grossly intact  EKG - nsr with pvc's  Assess/Plan: PVC's - I have discussed the treatment options with the patient. I have recommended he continue flecainide. If he has break through symptoms, he has been encouraged to take up to an extra 155m daily as needed.  GCarleene OverlieTaylor,MD

## 2023-03-27 ENCOUNTER — Other Ambulatory Visit: Payer: Self-pay | Admitting: Cardiovascular Disease

## 2023-03-27 ENCOUNTER — Other Ambulatory Visit: Payer: Self-pay

## 2023-03-28 ENCOUNTER — Other Ambulatory Visit (HOSPITAL_COMMUNITY): Payer: Self-pay

## 2023-03-28 MED ORDER — ATORVASTATIN CALCIUM 20 MG PO TABS
20.0000 mg | ORAL_TABLET | Freq: Every day | ORAL | 3 refills | Status: DC
  Filled 2023-03-28: qty 90, 90d supply, fill #0
  Filled 2023-07-25: qty 90, 90d supply, fill #1
  Filled 2023-12-12: qty 90, 90d supply, fill #2

## 2023-04-01 ENCOUNTER — Other Ambulatory Visit (HOSPITAL_COMMUNITY): Payer: Self-pay

## 2023-04-02 ENCOUNTER — Encounter: Payer: Self-pay | Admitting: Cardiovascular Disease

## 2023-04-02 ENCOUNTER — Other Ambulatory Visit (HOSPITAL_COMMUNITY): Payer: Self-pay

## 2023-04-02 MED ORDER — METOPROLOL SUCCINATE ER 25 MG PO TB24
25.0000 mg | ORAL_TABLET | Freq: Every day | ORAL | 3 refills | Status: DC
  Filled 2023-04-02 – 2023-04-08 (×3): qty 90, 90d supply, fill #0
  Filled 2023-07-03: qty 90, 90d supply, fill #1
  Filled 2023-09-17: qty 90, 90d supply, fill #2
  Filled 2023-12-30: qty 90, 90d supply, fill #3

## 2023-04-02 MED ORDER — METOPROLOL SUCCINATE ER 25 MG PO TB24: 25.0000 mg | ORAL_TABLET | Freq: Every day | ORAL | 3 refills | Status: DC

## 2023-04-02 NOTE — Addendum Note (Signed)
Addended by: Candie Chroman on: 04/02/2023 10:54 AM   Modules accepted: Orders

## 2023-04-08 ENCOUNTER — Other Ambulatory Visit: Payer: Self-pay

## 2023-04-08 ENCOUNTER — Other Ambulatory Visit (HOSPITAL_COMMUNITY): Payer: Self-pay

## 2023-04-19 ENCOUNTER — Other Ambulatory Visit: Payer: Self-pay

## 2023-04-19 ENCOUNTER — Other Ambulatory Visit (HOSPITAL_COMMUNITY): Payer: Self-pay

## 2023-05-09 DIAGNOSIS — R0789 Other chest pain: Secondary | ICD-10-CM | POA: Diagnosis not present

## 2023-05-09 DIAGNOSIS — R079 Chest pain, unspecified: Secondary | ICD-10-CM | POA: Diagnosis not present

## 2023-05-09 DIAGNOSIS — R002 Palpitations: Secondary | ICD-10-CM | POA: Diagnosis not present

## 2023-05-10 DIAGNOSIS — R079 Chest pain, unspecified: Secondary | ICD-10-CM | POA: Diagnosis not present

## 2023-06-10 ENCOUNTER — Telehealth: Payer: Self-pay | Admitting: Internal Medicine

## 2023-06-10 NOTE — Telephone Encounter (Signed)
Patient was returning call. Please advies

## 2023-06-10 NOTE — Telephone Encounter (Signed)
Spoke with pt who denies current CP, SOB or dizziness.  Pt states he was advised to f/u post ED appointment in May for CP.  Pt reports increasing palpitations and does not feel Flecainide is working as well.  Pt also reports random episodes of CP with palpitations. Calcium score, echo and heart monitor WNL 2023.  Pt taking medications as prescribed.  Reviewed ED precautions.  Pt verbalizes understanding and agrees with current plan.

## 2023-06-10 NOTE — Telephone Encounter (Signed)
  Per MyChart scheuling message:  Pt c/o of Chest Pain: STAT if CP now or developed within 24 hours  1. Are you having CP right now?   2. Are you experiencing any other symptoms (ex. SOB, nausea, vomiting, sweating)?   3. How long have you been experiencing CP?   4. Is your CP continuous or coming and going?   5. Have you taken Nitroglycerin?   1. Not at the moment. Was advised to have a follow up appointment last time I went to the ED. The Dr said it might be related to my PCVs? 2. Feeling fatigued at times. I think presyncope? when walking.  3. Seconds to a few mins, but it's more common than before. It's not continuous.  4. It comes and goes. Since the last week of May it has been more common for me to experience chest pain. Ever since the last increase in dosage of Flecainide it seem to have help, but now all my symptoms have returned and more common.   5. No. The only thing I'm taking is Metoprolol and Flecainide.  ?

## 2023-06-10 NOTE — Telephone Encounter (Signed)
Pt scheduled to see Dr Ladona Ridgel 06/11/2023.

## 2023-06-10 NOTE — Telephone Encounter (Signed)
Left voicemail message to call the clinic. 

## 2023-06-11 ENCOUNTER — Telehealth: Payer: Self-pay | Admitting: Cardiovascular Disease

## 2023-06-11 ENCOUNTER — Ambulatory Visit: Payer: No Typology Code available for payment source | Attending: Internal Medicine | Admitting: Internal Medicine

## 2023-06-11 ENCOUNTER — Other Ambulatory Visit (HOSPITAL_COMMUNITY): Payer: Self-pay

## 2023-06-11 ENCOUNTER — Encounter: Payer: Self-pay | Admitting: Internal Medicine

## 2023-06-11 VITALS — BP 106/64 | HR 61 | Ht 64.0 in | Wt 113.0 lb

## 2023-06-11 DIAGNOSIS — I493 Ventricular premature depolarization: Secondary | ICD-10-CM

## 2023-06-11 DIAGNOSIS — R002 Palpitations: Secondary | ICD-10-CM | POA: Diagnosis not present

## 2023-06-11 MED ORDER — FLECAINIDE ACETATE 100 MG PO TABS: 100.0000 mg | ORAL_TABLET | Freq: Two times a day (BID) | ORAL | 3 refills | Status: DC

## 2023-06-11 MED ORDER — FLECAINIDE ACETATE 100 MG PO TABS
100.0000 mg | ORAL_TABLET | Freq: Two times a day (BID) | ORAL | 3 refills | Status: DC
  Filled 2023-06-11 – 2023-06-12 (×2): qty 180, 90d supply, fill #0

## 2023-06-11 NOTE — Progress Notes (Signed)
HPI [name] returns today for followup. He is a pleasant 42 yo man with symptomatic PVC"s and has developed atypical chest pain as well. He notes that his flecainide appears to be improving his symptoms. He still feels his heart racing at times. He was initially better but now has had more symptoms. No other complaints.  He also c/o non-cardiac chest pain and has a normal coronary calcium score. His biggest complaint is that he feels at time like he is going to fall.  No Known Allergies   Current Outpatient Medications  Medication Sig Dispense Refill   atorvastatin (LIPITOR) 20 MG tablet Take 1 tablet (20 mg total) by mouth daily. 90 tablet 3   metoprolol succinate (TOPROL XL) 25 MG 24 hr tablet Take 1 tablet (25 mg total) by mouth daily. 90 tablet 3   flecainide (TAMBOCOR) 100 MG tablet Take 1 tablet (100 mg total) by mouth 2 (two) times daily. 180 tablet 3   Magnesium 400 MG CAPS Take by mouth.     No current facility-administered medications for this visit.     Past Medical History:  Diagnosis Date   Allergy    Cardiac arrhythmia, unspecified    Hyperlipidemia     ROS:   All systems reviewed and negative except as noted in the HPI.   History reviewed. No pertinent surgical history.   Family History  Problem Relation Age of Onset   Diabetes Mother    Hypertension Father    Heart attack Sister 22     Social History   Socioeconomic History   Marital status: Single    Spouse name: Not on file   Number of children: Not on file   Years of education: Not on file   Highest education level: Not on file  Occupational History   Not on file  Tobacco Use   Smoking status: Never   Smokeless tobacco: Never  Vaping Use   Vaping Use: Never used  Substance and Sexual Activity   Alcohol use: Not on file   Drug use: Never   Sexual activity: Not on file  Other Topics Concern   Not on file  Social History Narrative   Not on file   Social Determinants of  Health   Financial Resource Strain: Not on file  Food Insecurity: Not on file  Transportation Needs: Not on file  Physical Activity: Not on file  Stress: Not on file  Social Connections: Not on file  Intimate Partner Violence: Not on file     BP 106/64   Pulse 61   Ht 5\' 4"  (1.626 m)   Wt 113 lb (51.3 kg)   SpO2 98%   BMI 19.40 kg/m   Physical Exam:  Well appearing NAD HEENT: Unremarkable Neck:  No JVD, no thyromegally Lymphatics:  No adenopathy Back:  No CVA tenderness Lungs:  Clear with no wheezes HEART:  Regular rate rhythm, no murmurs, no rubs, no clicks Abd:  soft, positive bowel sounds, no organomegally, no rebound, no guarding Ext:  2 plus pulses, no edema, no cyanosis, no clubbing Skin:  No rashes no nodules Neuro:  CN II through XII intact, motor grossly intact  EKG - nsr  DEVICE  Normal device function.  See PaceArt for details.   Assess/Plan:  PVC's - I have discussed the treatment options with the patient. I have recommended he continue flecainide. We will increase his dose of flecainide and have him undergo exercise testing.  Joel Thompson Joel Minkoff,MD  Joel Thompson Joel Lazare,MD

## 2023-06-11 NOTE — Patient Instructions (Addendum)
Medication Instructions:  Your physician has recommended you make the following change in your medication:  1) INCREASE flecainide to 100 mg twice daily *If you need a refill on your cardiac medications before your next appointment, please call your pharmacy*  Testing/Procedures: Your physician has requested that you have an exercise tolerance test in two weeks.   Follow-Up: At [facility], you and your health needs are our priority.  As part of our continuing mission to provide you with exceptional heart care, we have created designated Provider Care Teams.  These Care Teams include your primary Cardiologist (physician) and Advanced Practice Providers (APPs -  Physician Assistants and Nurse Practitioners) who all work together to provide you with the care you need, when you need it.  Your next appointment:   Follow up with Dr. Ladona Ridgel based on results of your stress test.

## 2023-06-11 NOTE — Telephone Encounter (Signed)
Refill for Flecainide has been sent to [facility].

## 2023-06-11 NOTE — Telephone Encounter (Signed)
*  STAT* If patient is at the pharmacy, call can be transferred to refill team.   1. Which medications need to be refilled? (please list name of each medication and dose if known)   flecainide (TAMBOCOR) 100 MG tablet   2. Which pharmacy/location (including street and city if local pharmacy) is medication to be sent to?  [place] [facility]   3. Do they need a 30 day or 90 day supply?   90 day  Patient stated his medication was sent to [facility] in error.  Patient wants refill sent to [facility].

## 2023-06-12 ENCOUNTER — Other Ambulatory Visit (HOSPITAL_COMMUNITY): Payer: Self-pay

## 2023-06-17 ENCOUNTER — Other Ambulatory Visit: Payer: Self-pay | Admitting: Internal Medicine

## 2023-06-17 DIAGNOSIS — Z5181 Encounter for therapeutic drug level monitoring: Secondary | ICD-10-CM

## 2023-06-17 DIAGNOSIS — R002 Palpitations: Secondary | ICD-10-CM

## 2023-06-17 DIAGNOSIS — I493 Ventricular premature depolarization: Secondary | ICD-10-CM

## 2023-06-17 DIAGNOSIS — R079 Chest pain, unspecified: Secondary | ICD-10-CM

## 2023-06-27 ENCOUNTER — Ambulatory Visit: Payer: Managed Care, Other (non HMO) | Attending: Internal Medicine

## 2023-06-27 DIAGNOSIS — I493 Ventricular premature depolarization: Secondary | ICD-10-CM | POA: Diagnosis not present

## 2023-06-27 DIAGNOSIS — Z79899 Other long term (current) drug therapy: Secondary | ICD-10-CM

## 2023-06-27 DIAGNOSIS — R002 Palpitations: Secondary | ICD-10-CM | POA: Diagnosis not present

## 2023-06-27 DIAGNOSIS — R079 Chest pain, unspecified: Secondary | ICD-10-CM

## 2023-06-27 DIAGNOSIS — Z5181 Encounter for therapeutic drug level monitoring: Secondary | ICD-10-CM | POA: Diagnosis not present

## 2023-06-27 LAB — EXERCISE TOLERANCE TEST
Angina Index: 0
Duke Treadmill Score: 10
Estimated workload: 12
Exercise duration (min): 10 min
Exercise duration (sec): 10 s
MPHR: 181 {beats}/min
Peak HR: 126 {beats}/min
Percent HR: 69 %
RPE: 15
Rest HR: 62 {beats}/min
ST Depression (mm): 0 mm

## 2023-07-04 ENCOUNTER — Other Ambulatory Visit (HOSPITAL_COMMUNITY): Payer: Self-pay

## 2023-07-26 ENCOUNTER — Other Ambulatory Visit (HOSPITAL_COMMUNITY): Payer: Self-pay

## 2023-09-10 ENCOUNTER — Encounter: Payer: Self-pay | Admitting: Internal Medicine

## 2023-09-11 MED ORDER — FLECAINIDE ACETATE 100 MG PO TABS: 100.0000 mg | ORAL_TABLET | Freq: Two times a day (BID) | ORAL | 3 refills | Status: DC

## 2023-09-18 ENCOUNTER — Other Ambulatory Visit (HOSPITAL_COMMUNITY): Payer: Self-pay

## 2024-03-25 ENCOUNTER — Encounter: Payer: Self-pay | Admitting: Internal Medicine

## 2024-03-26 MED ORDER — METOPROLOL SUCCINATE ER 25 MG PO TB24: 25.0000 mg | ORAL_TABLET | Freq: Every day | ORAL | 0 refills | Status: DC

## 2024-04-06 ENCOUNTER — Other Ambulatory Visit: Payer: Self-pay | Admitting: Internal Medicine

## 2024-04-08 ENCOUNTER — Encounter: Payer: Self-pay | Admitting: Internal Medicine

## 2024-04-09 ENCOUNTER — Other Ambulatory Visit (HOSPITAL_COMMUNITY): Payer: Self-pay

## 2024-04-09 ENCOUNTER — Encounter (HOSPITAL_COMMUNITY): Payer: Self-pay

## 2024-04-09 MED ORDER — ATORVASTATIN CALCIUM 20 MG PO TABS
20.0000 mg | ORAL_TABLET | Freq: Every day | ORAL | 0 refills | Status: DC
  Filled 2024-04-09: qty 30, 30d supply, fill #0

## 2024-04-28 ENCOUNTER — Encounter: Payer: Self-pay | Admitting: Cardiovascular Disease

## 2024-04-28 ENCOUNTER — Other Ambulatory Visit: Payer: Self-pay

## 2024-04-28 MED ORDER — METOPROLOL SUCCINATE ER 25 MG PO TB24: 25.0000 mg | ORAL_TABLET | Freq: Every day | ORAL | 0 refills | Status: DC

## 2024-05-20 ENCOUNTER — Encounter: Payer: Self-pay | Admitting: Cardiovascular Disease

## 2024-05-20 ENCOUNTER — Ambulatory Visit: Attending: Cardiology | Admitting: Cardiovascular Disease

## 2024-05-20 ENCOUNTER — Other Ambulatory Visit (HOSPITAL_COMMUNITY): Payer: Self-pay

## 2024-05-20 ENCOUNTER — Ambulatory Visit: Admitting: Cardiovascular Disease

## 2024-05-20 VITALS — BP 122/76 | HR 67 | Ht 64.0 in | Wt 128.0 lb

## 2024-05-20 DIAGNOSIS — I493 Ventricular premature depolarization: Secondary | ICD-10-CM

## 2024-05-20 DIAGNOSIS — E782 Mixed hyperlipidemia: Secondary | ICD-10-CM

## 2024-05-20 MED ORDER — ATORVASTATIN CALCIUM 20 MG PO TABS
20.0000 mg | ORAL_TABLET | Freq: Every day | ORAL | 3 refills | Status: AC
  Filled 2024-05-20: qty 90, 90d supply, fill #0
  Filled 2024-08-25: qty 90, 90d supply, fill #1
  Filled 2024-11-22: qty 90, 90d supply, fill #2

## 2024-05-20 MED ORDER — METOPROLOL SUCCINATE ER 25 MG PO TB24: 25.0000 mg | ORAL_TABLET | Freq: Every day | ORAL | 3 refills | Status: DC

## 2024-05-20 NOTE — Assessment & Plan Note (Signed)
 History of hyperlipidemia on statin therapy with lipid profile performed 02/22/2022 Joel Thompson total cholesterol 140, LDL 79 HDL 42.  His coronary calcium  score was 0.  This was appropriate for primary prevention.

## 2024-05-20 NOTE — Progress Notes (Signed)
 05/20/2024 Joel Thompson   Oct 29, 1981  [id number]  Primary Physician [place], Betty G, MD Primary Cardiologist: Avanell Leigh MD Bennye Bravo, [place]  HPI:  Joel Thompson is a 43 y.o.    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  He was referred by his PCP, Betty [place], for cardiac evaluation because of rare/infrequent palpitations.  I last saw him in the office 11/14/2022.  He has no cardiac risk factors.  He was seen at [facility] earlier this year with atypical chest pain thought not to be ischemically mediated.  He had episode of tachypalpitations in July which was self-limited.  He said no recurrent episodes.  During these episodes he had no other symptoms such as dizziness or loss of consciousness.  He drinks 1 cup of coffee a day which now is decaf.  He did have a negative GXT with PVCs in recovery phase 01/04/2021 and a 2D echo performed 01/12/2021 which was entirely normal.     He had a negative sleep study performed by Dr. Micael Adas ruling out sleep apnea.  He saw Dr. 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  Dr. Carolynne Citron has placed him on flecainide  and uptitrated the dose of which has resulted in marked improvement in his symptoms.  He does not drink caffeinated beverages.  Since I saw him a year and a half ago he has remained stable.  His flecainide  has been controlling his PVCs.  He denies chest pain or shortness of breath.   Current Meds  Medication Sig   atorvastatin  (LIPITOR) 20 MG tablet Take 1 tablet (20 mg total) by mouth daily.   flecainide  (TAMBOCOR ) 100 MG tablet Take 1 tablet (100 mg total) by mouth 2 (two) times daily.   metoprolol  succinate (TOPROL  XL) 25 MG 24 hr tablet Take 1 tablet (25 mg total) by mouth daily.     No Known Allergies  Social History   Socioeconomic History   Marital status: Single    Spouse name: Not on file   Number of children: Not on file   Years of  education: Not on file   Highest education level: Not on file  Occupational History   Not on file  Tobacco Use   Smoking status: Never   Smokeless tobacco: Never  Vaping Use   Vaping status: Never Used  Substance and Sexual Activity   Alcohol use: Not on file   Drug use: Never   Sexual activity: Not on file  Other Topics Concern   Not on file  Social History Narrative   Not on file   Social Drivers of Health   Financial Resource Strain: Not on file  Food Insecurity: Not on file  Transportation Needs: Not on file  Physical Activity: Not on file  Stress: Not on file  Social Connections: Unknown (04/10/2022)   [facility], [facility]   Social Network    Social Network: Not on file  Intimate Partner Violence: Unknown (03/15/2022)   [facility], [facility]   HITS    Physically Hurt: Not on file    Insult or Talk Down To: Not on file    Threaten Physical Harm: Not on file    Scream or Curse: Not on file     Review of Systems: General: negative for chills, fever, night sweats or weight changes.  Cardiovascular: negative for chest pain, dyspnea on exertion,  edema, orthopnea, palpitations, paroxysmal nocturnal dyspnea or shortness of breath Dermatological: negative for rash Respiratory: negative for cough or wheezing Urologic: negative for hematuria Abdominal: negative for nausea, vomiting, diarrhea, bright red blood per rectum, melena, or hematemesis Neurologic: negative for visual changes, syncope, or dizziness All other systems reviewed and are otherwise negative except as noted above.    Blood pressure 122/76, pulse 67, height 5' 4 (1.626 m), weight 128 lb (58.1 kg), SpO2 96%.  General appearance: alert and no distress Neck: no adenopathy, no carotid bruit, no JVD, supple, symmetrical, trachea midline, and thyroid  not enlarged, symmetric, no tenderness/mass/nodules Lungs: clear to auscultation bilaterally Heart: regular rate and  rhythm, S1, S2 normal, no murmur, click, rub or gallop Extremities: extremities normal, atraumatic, no cyanosis or edema Pulses: 2+ and symmetric Skin: Skin color, texture, turgor normal. No rashes or lesions Neurologic: Grossly normal  EKG EKG Interpretation Date/Time:  Wednesday May 20 2024 13:49:22 EDT Ventricular Rate:  67 PR Interval:  190 QRS Duration:  94 QT Interval:  424 QTC Calculation: 448 R Axis:   71  Text Interpretation: Normal sinus rhythm Nonspecific T wave abnormality When compared with ECG of 11-Jun-2023 11:31, Non-specific change in ST segment in Anterior leads Nonspecific T wave abnormality now evident in Anterior leads Confirmed by Lauro Portal [phone]) on 05/20/2024 2:01:04 PM    ASSESSMENT AND PLAN:   Hyperlipidemia, mixed History of hyperlipidemia on statin therapy with lipid profile performed 02/22/2022 Joel Thompson total cholesterol 140, LDL 79 HDL 42.  His coronary calcium  score was 0.  This was appropriate for primary prevention.  PVC's (premature ventricular contractions) History of PVCs with high burden seen by Dr. Carolynne Citron on flecainide .  His symptoms have improved since his dose has been uptitrated.  He is scheduled to see Dr. Carolynne Citron back in the office next month.     Avanell Leigh MD FACP,FACC,FAHA, [facility] 05/20/2024 2:07 PM

## 2024-05-20 NOTE — Assessment & Plan Note (Signed)
 History of PVCs with high burden seen by Dr. Carolynne Citron on flecainide .  His symptoms have improved since his dose has been uptitrated.  He is scheduled to see Dr. Carolynne Citron back in the office next month.

## 2024-05-20 NOTE — Patient Instructions (Signed)
 Medication Instructions:  No changes *If you need a refill on your cardiac medications before your next appointment, please call your pharmacy*  Follow-Up: At [facility], you and your health needs are our priority.  As part of our continuing mission to provide you with exceptional heart care, our providers are all part of one team.  This team includes your primary Cardiologist (physician) and Advanced Practice Providers or APPs (Physician Assistants and Nurse Practitioners) who all work together to provide you with the care you need, when you need it.  Your next appointment:   2 year(s)  Provider:   Dr Katheryne Pane  We recommend signing up for the patient portal called MyChart.  Sign up information is provided on this After Visit Summary.  MyChart is used to connect with patients for Virtual Visits (Telemedicine).  Patients are able to view lab/test results, encounter notes, upcoming appointments, etc.  Non-urgent messages can be sent to your provider as well.   To learn more about what you can do with MyChart, go to [email or web address].

## 2024-08-06 ENCOUNTER — Ambulatory Visit: Admitting: Physician Assistant

## 2024-08-13 ENCOUNTER — Ambulatory Visit: Attending: Cardiology | Admitting: Physician Assistant

## 2024-08-13 ENCOUNTER — Other Ambulatory Visit (HOSPITAL_COMMUNITY): Payer: Self-pay

## 2024-08-13 ENCOUNTER — Encounter: Payer: Self-pay | Admitting: Physician Assistant

## 2024-08-13 VITALS — BP 115/75 | HR 64 | Resp 16 | Ht 64.0 in | Wt 129.6 lb

## 2024-08-13 DIAGNOSIS — I493 Ventricular premature depolarization: Secondary | ICD-10-CM

## 2024-08-13 DIAGNOSIS — Z79899 Other long term (current) drug therapy: Secondary | ICD-10-CM | POA: Diagnosis not present

## 2024-08-13 DIAGNOSIS — Z5181 Encounter for therapeutic drug level monitoring: Secondary | ICD-10-CM | POA: Diagnosis not present

## 2024-08-13 MED ORDER — FLECAINIDE ACETATE 100 MG PO TABS
100.0000 mg | ORAL_TABLET | Freq: Two times a day (BID) | ORAL | 3 refills | Status: DC
  Filled 2024-08-13: qty 180, 90d supply, fill #0

## 2024-08-13 MED ORDER — METOPROLOL SUCCINATE ER 25 MG PO TB24: 25.0000 mg | ORAL_TABLET | Freq: Every day | ORAL | 3 refills | Status: AC

## 2024-08-13 MED ORDER — METOPROLOL SUCCINATE ER 25 MG PO TB24
25.0000 mg | ORAL_TABLET | Freq: Every day | ORAL | 3 refills | Status: DC
  Filled 2024-08-13: qty 90, 90d supply, fill #0

## 2024-08-13 MED ORDER — FLECAINIDE ACETATE 100 MG PO TABS: 100.0000 mg | ORAL_TABLET | Freq: Two times a day (BID) | ORAL | 3 refills | Status: AC

## 2024-08-13 NOTE — Progress Notes (Signed)
 Cardiology Office Note:  .   Date:  08/13/2024  ID:  Matej Sappenfield, DOB October 15, 1981, MRN [id number] PCP: [place], Betty G, MD  [facility] Providers Cardiologist:  Danelle Birmingham, MD Cardiology APP:  Madie Jon Garre, [place] {  History of Present Illness: .   Mykah Shin is a 43 y.o. male w/PMHx of  PVCs  He last saw Dr. Birmingham 06/11/23, discussed symptoms palpitations initially better with flecainide  though increasing again Mentions noncardiac CP and biggest concern at this visit was sense that he is going to fall Flecainide  dose was increased and planned for ETT    No ST deviation was noted. ETT with good exercise capacity (10:10); no CP; normal BP response; no ST changes; no exercise induced arrhythmia; negative inadequate ETT (pt achieved 69% THR). No ST deviation was noted. Arrhythmias during stress: rare PVCs. There were no arrhythmias during recovery. ECG was interpretable and without significant changes.  (Exercised )  He saw Dr. Court 05/20/24, reported improved symptoms, no changes were made  Today's visit is scheduled as an annual visit ROS:   He feels well. Has some sharp fleeting pains, these are L sided, fairly random, not positional,  less since his increase in flecainide  dose No CP otherwise No near syncope or syncope Some generalized fatigue No SOB  Arrhythmia/AAD hx PVCs (16% at baseline) Flecainide  started July 2023 Up-titrated July 2024  Studies Reviewed: SABRA    EKG done today and reviewed by myself:  SR 64bpm, PR , QRS 96ms, QTc , no PVCs  June 2023 Monitor 1 Patient had a min HR of 59 bpm, max HR of 148 bpm, and avg HR of 88 bpm. Predominant underlying rhythm was Sinus Rhythm. Isolated SVEs were rare (<1.0%), and no SVE Couplets or SVE Triplets were present. Isolated VEs were frequent (13.8%, 4581), and no  VE Couplets or VE Triplets were present. Ventricular Bigeminy and Trigeminy were present.    Monitor 2 Patient had a min HR of  51 bpm, max HR of 169 bpm, and avg HR of 71 bpm. Predominant underlying rhythm was Sinus Rhythm. 1 run of Ventricular Tachycardia occurred lasting 4 beats with a max rate of 169 bpm (avg 132 bpm). Isolated SVEs were rare (<1.0%),  and no SVE Couplets or SVE Triplets were present. Isolated VEs were frequent (16.9%, [id number]), VE Couplets were rare (<1.0%, 3645), and no VE Triplets were present. Ventricular Bigeminy and Trigeminy were present.    1. SR/SB/ST 2. Frequent PVCs (16% bourdon) 3. ROV to discuss    09/08/21: coronary Ca++ score IMPRESSION: Coronary calcium  score of 0. This was 0 percentile for age-, race-, and sex-matched controls.  01/12/21: TTE 1. Left ventricular ejection fraction, by estimation, is 55 to 60%. The  left ventricle has normal function. The left ventricle has no regional  wall motion abnormalities. Left ventricular diastolic parameters were  normal.   2. Right ventricular systolic function is normal. The right ventricular  size is normal. There is normal pulmonary artery systolic pressure.   3. The mitral valve is normal in structure. Trivial mitral valve  regurgitation. No evidence of mitral stenosis.   4. The aortic valve is tricuspid. Aortic valve regurgitation is trivial.  No aortic stenosis is present.     Risk Assessment/Calculations:    Physical Exam:   VS:  There were no vitals taken for this visit.   Wt Readings from Last 3 Encounters:  05/20/24 128 lb (58.1 kg)  06/11/23 113 lb (51.3 kg)  01/29/23  105 lb 12.8 oz (48 kg)    GEN: Well nourished, well developed in no acute distress NECK: No JVD; No carotid bruits CARDIAC: RRR, no extrasystoles, no murmurs, rubs, gallops RESPIRATORY:  CTA b/l without rales, wheezing or rhonchi  ABDOMEN: Soft, non-tender, non-distended EXTREMITIES: No edema; No deformity   ASSESSMENT AND PLAN: .    PVCs flecainide  w/metoprolol  stable intervals infrequent burden by symptoms None on his EKG or exam  today   Dispo: back in 24mo, sooner if needed  Signed, Charlies Macario Arthur, PA-C

## 2024-08-13 NOTE — Addendum Note (Signed)
 Addended by: RUBBIE KERRI HERO on: 08/13/2024 03:24 PM   Modules accepted: Orders

## 2024-08-13 NOTE — Patient Instructions (Addendum)
 Medication Instructions:   Your physician recommends that you continue on your current medications as directed. Please refer to the Current Medication list given to you today.   *If you need a refill on your cardiac medications before your next appointment, please call your pharmacy*   Lab Work: NONE ORDERED  TODAY    If you have labs (blood work) drawn today and your tests are completely normal, you will receive your results only by: MyChart Message (if you have MyChart) OR A paper copy in the mail If you have any lab test that is abnormal or we need to change your treatment, we will call you to review the results.   Testing/Procedures: NONE ORDERED  TODAY    Follow-Up: At [facility], you and your health needs are our priority.  As part of our continuing mission to provide you with exceptional heart care, our providers are all part of one team.  This team includes your primary Cardiologist (physician) and Advanced Practice Providers or APPs (Physician Assistants and Nurse Practitioners) who all work together to provide you with the care you need, when you need it.  Your next appointment:    6 month(s)   Provider:    Charlies Arthur, PA-C    We recommend signing up for the patient portal called "MyChart".  Sign up information is provided on this After Visit Summary.  MyChart is used to connect with patients for Virtual Visits (Telemedicine).  Patients are able to view lab/test results, encounter notes, upcoming appointments, etc.  Non-urgent messages can be sent to your provider as well.   To learn more about what you can do with MyChart, go to [email or web address].   Other Instructions
